# Patient Record
Sex: Male | Born: 1975 | Hispanic: Yes | Marital: Single | Smoking: Current every day smoker
Health system: Southern US, Community
[De-identification: ages and names within clinical notes are randomized; demographics above are authoritative.]

## PROBLEM LIST (undated history)

## (undated) DIAGNOSIS — R634 Abnormal weight loss: Secondary | ICD-10-CM

## (undated) HISTORY — PX: NO PAST SURGERIES: SHX2092

---

## 2020-05-09 ENCOUNTER — Emergency Department (HOSPITAL_COMMUNITY): Payer: Self-pay

## 2020-05-09 ENCOUNTER — Other Ambulatory Visit: Payer: Self-pay

## 2020-05-09 ENCOUNTER — Emergency Department (HOSPITAL_COMMUNITY)
Admission: EM | Admit: 2020-05-09 | Discharge: 2020-05-09 | Disposition: A | Discharge status: Home / Self Care | Payer: Self-pay | Attending: Emergency Medicine | Admitting: Emergency Medicine

## 2020-05-09 ENCOUNTER — Encounter (HOSPITAL_COMMUNITY): Payer: Self-pay

## 2020-05-09 DIAGNOSIS — R110 Nausea: Secondary | ICD-10-CM | POA: Insufficient documentation

## 2020-05-09 DIAGNOSIS — R1031 Right lower quadrant pain: Secondary | ICD-10-CM | POA: Insufficient documentation

## 2020-05-09 DIAGNOSIS — Z20822 Contact with and (suspected) exposure to covid-19: Secondary | ICD-10-CM | POA: Insufficient documentation

## 2020-05-09 DIAGNOSIS — I10 Essential (primary) hypertension: Secondary | ICD-10-CM | POA: Insufficient documentation

## 2020-05-09 LAB — CBC
HCT: 46.8 % (ref 39.0–52.0)
Hemoglobin: 16.5 g/dL (ref 13.0–17.0)
MCH: 32.2 pg (ref 26.0–34.0)
MCHC: 35.3 g/dL (ref 30.0–36.0)
MCV: 91.2 fL (ref 80.0–100.0)
Platelets: 298 10*3/uL (ref 150–400)
RBC: 5.13 MIL/uL (ref 4.22–5.81)
RDW: 12.2 % (ref 11.5–15.5)
WBC: 15.7 10*3/uL — ABNORMAL HIGH (ref 4.0–10.5)
nRBC: 0 % (ref 0.0–0.2)

## 2020-05-09 LAB — URINALYSIS, ROUTINE W REFLEX MICROSCOPIC
Bilirubin Urine: NEGATIVE
Glucose, UA: NEGATIVE mg/dL
Hgb urine dipstick: NEGATIVE
Ketones, ur: NEGATIVE mg/dL
Leukocytes,Ua: NEGATIVE
Nitrite: NEGATIVE
Protein, ur: NEGATIVE mg/dL
Specific Gravity, Urine: 1.013 (ref 1.005–1.030)
pH: 8 (ref 5.0–8.0)

## 2020-05-09 LAB — COMPREHENSIVE METABOLIC PANEL
ALT: 48 U/L — ABNORMAL HIGH (ref 0–44)
AST: 34 U/L (ref 15–41)
Albumin: 3.9 g/dL (ref 3.5–5.0)
Alkaline Phosphatase: 78 U/L (ref 38–126)
Anion gap: 10 (ref 5–15)
BUN: 10 mg/dL (ref 6–20)
CO2: 26 mmol/L (ref 22–32)
Calcium: 9.4 mg/dL (ref 8.9–10.3)
Chloride: 101 mmol/L (ref 98–111)
Creatinine, Ser: 0.73 mg/dL (ref 0.61–1.24)
GFR calc Af Amer: >60 mL/min (ref >60)
GFR calc non Af Amer: >60 mL/min (ref >60)
Glucose, Bld: 100 mg/dL — ABNORMAL HIGH (ref 70–99)
Potassium: 3.8 mmol/L (ref 3.5–5.1)
Sodium: 137 mmol/L (ref 135–145)
Total Bilirubin: 0.9 mg/dL (ref 0.3–1.2)
Total Protein: 7.3 g/dL (ref 6.5–8.1)

## 2020-05-09 LAB — LIPASE, BLOOD: Lipase: 23 U/L (ref 11–51)

## 2020-05-09 LAB — RESPIRATORY PANEL BY RT PCR (FLU A&B, COVID)
Influenza A by PCR: NEGATIVE
Influenza B by PCR: NEGATIVE
SARS Coronavirus 2 by RT PCR: NEGATIVE

## 2020-05-09 MED ORDER — MORPHINE SULFATE (PF) 4 MG/ML IV SOLN
4.0000 mg | Freq: Once | INTRAVENOUS | Status: AC
  Administered 2020-05-09: 4 mg via INTRAVENOUS
  Filled 2020-05-09: qty 1

## 2020-05-09 MED ORDER — HYDROCODONE-ACETAMINOPHEN 5-325 MG PO TABS
1.0000 | ORAL_TABLET | Freq: Once | ORAL | Status: AC
  Administered 2020-05-09: 1 via ORAL
  Filled 2020-05-09: qty 1

## 2020-05-09 MED ORDER — IOHEXOL 300 MG/ML  SOLN
100.0000 mL | Freq: Once | INTRAMUSCULAR | Status: AC | PRN
  Administered 2020-05-09: 100 mL via INTRAVENOUS

## 2020-05-09 NOTE — ED Notes (Signed)
During triage pt also reports SOB on exertion and mild cough, denies having any COVID vaccines

## 2020-05-09 NOTE — Social Work (Signed)
CSW met with Pt at bedside with the aid of [employer] [facility] # [phone]) CSW counseled Pt on importance of utilizing a PCP. CSW provided Pt with information about [facility] as it is the closest to his home address. Information was also added to AVS

## 2020-05-09 NOTE — ED Provider Notes (Signed)
[facility] EMERGENCY DEPARTMENT Provider Note   CSN: [id number] Arrival date & time: 05/09/20  1053     History Chief Complaint  Patient presents with   Abdominal Pain    Spencer Whitaker is a 44 y.o. male with past medical history of hypertension who presents with abdominal pain.  Patient endorses acute onset of pain around 3 AM with nausea. Pain was initially more generalized, but has since moved to his right lower quadrant. Denies emesis, fever, changes in bowel habits, or urinary symptoms. Denies penile or testicular pain or swelling. Has never had pain like this before. He is no longer nauseous at time of encounter.  Patient states he moved from [place] in January and has not had a PCP since. States he was taking blood pressure medication in [place], but has not had that medication since he moved.   Abdominal Pain Pain location:  RLQ Pain radiates to:  Back Pain severity:  Severe Onset quality:  Sudden Timing:  Constant Progression:  Unchanged Chronicity:  New Relieved by:  Nothing Worsened by:  Palpation Ineffective treatments:  None tried Associated symptoms: nausea   Associated symptoms: no chest pain, no chills, no constipation, no cough, no diarrhea, no dysuria, no fever, no hematuria, no shortness of breath, no sore throat and no vomiting        History reviewed. No pertinent past medical history.  There are no problems to display for this patient.   History reviewed. No pertinent surgical history.     No family history on file.  Social History   Tobacco Use   Smoking status: Not on file  Substance Use Topics   Alcohol use: Not on file   Drug use: Not on file    Home Medications Prior to Admission medications   Not on File    Allergies    Patient has no allergy information on record.  Review of Systems   Review of Systems  Constitutional: Negative for chills and fever.  HENT: Negative for ear pain and sore  throat.   Eyes: Negative for pain and visual disturbance.  Respiratory: Negative for cough and shortness of breath.   Cardiovascular: Negative for chest pain and palpitations.  Gastrointestinal: Positive for abdominal pain and nausea. Negative for constipation, diarrhea and vomiting.  Genitourinary: Negative for dysuria and hematuria.  Musculoskeletal: Negative for arthralgias and back pain.  Skin: Negative for color change and rash.  Neurological: Negative for seizures and syncope.  All other systems reviewed and are negative.   Physical Exam Updated Vital Signs BP (!) 136/103 (BP Location: Left Arm)    Pulse 83    Temp 99.4 F (37.4 C) (Oral)    Resp 15    Ht 5' 8.9" (1.75 m)    Wt 70 kg    SpO2 99%    BMI 22.86 kg/m   Physical Exam Vitals and nursing note reviewed.  Constitutional:      Appearance: He is well-developed.  HENT:     Head: Normocephalic and atraumatic.  Eyes:     Conjunctiva/sclera: Conjunctivae normal.  Cardiovascular:     Rate and Rhythm: Normal rate and regular rhythm.     Heart sounds: No murmur heard.   Pulmonary:     Effort: Pulmonary effort is normal. No respiratory distress.     Breath sounds: Normal breath sounds.  Abdominal:     Palpations: Abdomen is soft.     Tenderness: There is abdominal tenderness in the right lower quadrant.  There is no right CVA tenderness, left CVA tenderness, guarding or rebound. Negative signs include Rovsing's sign.  Musculoskeletal:     Cervical back: Neck supple.  Skin:    General: Skin is warm and dry.  Neurological:     General: No focal deficit present.     Mental Status: He is alert and oriented to person, place, and time.     ED Results / Procedures / Treatments   Labs (all labs ordered are listed, but only abnormal results are displayed) Labs Reviewed  COMPREHENSIVE METABOLIC PANEL - Abnormal; Notable for the following components:      Result Value   Glucose, Bld 100 (*)    ALT 48 (*)    All other  components within normal limits  CBC - Abnormal; Notable for the following components:   WBC 15.7 (*)    All other components within normal limits  RESPIRATORY PANEL BY RT PCR (FLU A&B, COVID)  LIPASE, BLOOD  URINALYSIS, ROUTINE W REFLEX MICROSCOPIC    EKG None  Radiology CT ABDOMEN PELVIS W CONTRAST  Result Date: 05/09/2020 CLINICAL DATA:  Right lower quadrant abdominal pain. EXAM: CT ABDOMEN AND PELVIS WITH CONTRAST TECHNIQUE: Multidetector CT imaging of the abdomen and pelvis was performed using the standard protocol following bolus administration of intravenous contrast. CONTRAST:  OMNIPAQUE IOHEXOL 300 MG/ML  SOLN COMPARISON:  None. FINDINGS: Lower chest: Very mild atelectasis is seen within the posterior aspects of the bilateral lung bases. Hepatobiliary: There is mild diffuse fatty infiltration of the liver parenchyma. No focal liver abnormality is seen. No gallstones, gallbladder wall thickening, or biliary dilatation. Pancreas: Unremarkable. No pancreatic ductal dilatation or surrounding inflammatory changes. Spleen: Normal in size without focal abnormality. Adrenals/Urinary Tract: Adrenal glands are unremarkable. Kidneys are normal, without renal calculi, focal lesion, or hydronephrosis. Urinary bladder is partially contracted. Mild diffuse urinary bladder wall thickening is noted. Stomach/Bowel: Stomach is within normal limits. Appendix appears normal. No evidence of bowel dilatation. Noninflamed diverticula are seen throughout the large bowel. Vascular/Lymphatic: No significant vascular findings are present. No enlarged abdominal or pelvic lymph nodes. Reproductive: Prostate is unremarkable. Other: No abdominal wall hernia or abnormality. No abdominopelvic ascites. Musculoskeletal: No acute or significant osseous findings. IMPRESSION: 1. Colonic diverticulosis. 2. Fatty liver. 3. Mild urinary bladder wall thickening, likely secondary to the contracted nature of the bladder.  Electronically Signed   By: Aram Candela M.D.   On: 05/09/2020 20:28   DG Chest Portable 1 View  Result Date: 05/09/2020 CLINICAL DATA:  Shortness of breath on exertion EXAM: PORTABLE CHEST 1 VIEW COMPARISON:  None. FINDINGS: Bilateral interstitial thickening. No pleural effusion or pneumothorax. Normal cardiomediastinal silhouette. No aggressive osseous lesion. IMPRESSION: Bilateral interstitial thickening concerning for mild interstitial edema versus infection. Electronically Signed   By: Elige Ko   On: 05/09/2020 12:34    Procedures Procedures (including critical care time)  Medications Ordered in ED Medications  HYDROcodone-acetaminophen (NORCO/VICODIN) 5-325 MG per tablet 1 tablet (1 tablet Oral Given 05/09/20 1636)  morphine 4 MG/ML injection 4 mg (4 mg Intravenous Given 05/09/20 1824)  iohexol (OMNIPAQUE) 300 MG/ML solution 100 mL (100 mLs Intravenous Contrast Given 05/09/20 2011)    ED Course  I have reviewed the triage vital signs and the nursing notes.  Pertinent labs & imaging results that were available during my care of the patient were reviewed by me and considered in my medical decision making (see chart for details).    MDM Rules/Calculators/A&P  UA not consistent with UTI.  Normal lipase not consistent with pancreatitis.  Patient has mild elevation of ALT and a leukocytosis of 15.7. Covid negative.  CT abdomen negative for acute appendicitis or other intra-abdominal pathology.   No clear etiology of patient symptoms at this time.  He was given strict return precautions for signs of developing appendicitis.  Patient verbalized understanding and agreement, and was discharged home.   In addition, consult placed for care transition team for PCP needs.  Patient instructed to call [facility] to schedule an appointment.  This patient was seen with Dr. Particia Nearing. A spanish interpreter was used for the duration of this encounter.   Final Clinical Impression(s) / ED Diagnoses Final diagnoses:  Right lower quadrant abdominal pain    Rx / DC Orders ED Discharge Orders    None       Allayne Butcher, MD 05/09/20 2130    Jacalyn Lefevre, MD 05/09/20 2325

## 2020-05-09 NOTE — ED Triage Notes (Signed)
Spanish interpreter used for triage:   Pt reports abd pain since 3am that woke him out of his sleep. Pt having some n/v. Denies diarrhea or constipation.

## 2020-05-09 NOTE — Discharge Instructions (Addendum)
You may take tylenol (also called acetominophen) or ibuprofen (also called advil or motrin) for pain at home. Please return to the emergency department if you have worsening pain, vomiting, fever over 100.4, or other concerning symtpoms.  Call [facility] to make an appointment with a general doctor for your high blood pressure. The phone number is included in these papers.

## 2020-05-09 NOTE — ED Triage Notes (Signed)
Emergency Medicine Provider Triage Evaluation Note  Spencer Whitaker , a 44 y.o. male  was evaluated in triage.  Pt complains of abd pain.   Pt states he had acute onset abd pain around 3 am. He has associated nausea, no vomiting. No fever. No problems with BM or urination. No h/o similar. 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 He has no other medical problems. Takes no medications daily.   Review of Systems  Positive: abd pain, nausea Negative: Fever, diarrhea, constipation, urinary sx  Physical Exam  BP 121/82 (BP Location: Right Arm)    Pulse 88    Temp 99.4 F (37.4 C) (Oral)    Resp 16    Ht 5' 8.9" (1.75 m)    Wt 70 kg    SpO2 97%    BMI 22.86 kg/m  Gen:   Awake, no distress   HEENT:  Atraumatic  Resp:  Normal effort  Cardiac:  Normal rate Abd:   Nondistended. Mild ttp of rlq, pt reports pain with generalized ttp. Negative murphys.  MSK:   Moves extremities without difficulty  Neuro:  Speech clear  Medical Decision Making  Medically screening exam initiated at 4:22 PM.  Appropriate orders placed.  Spencer Whitaker was informed that the remainder of the evaluation will be completed by another provider, this initial triage assessment does not replace that evaluation, and the importance of remaining in the ED until their evaluation is complete.  Clinical Impression    Pt with abd pain, rosen in RLQ. associated nausea. Labs show mild leukocytosis. As pt has worsened pain in rlq, will order ct to r/o appy vs kidney stone vs viral gi illness.    Alveria Apley, PA-C 05/09/20 1627

## 2020-05-09 NOTE — Progress Notes (Signed)
   05/09/20 1800  TOC ED Mini Assessment  TOC Time spent with patient (minutes): 20  PING Used in TOC Assessment No  Admission or Readmission Diverted Yes  Interventions which prevented an admission or readmission Other (must enter comment) (PCP resources provided (after hours for appointment))  What brought you to the Emergency Department?  Abdominal pain  Barriers to Discharge No Barriers Identified  Means of departure Not know  CMS Medicare.gov Compare Post Acute Care list provided to: Patient  Choice offered to / list presented to  Patient

## 2020-05-27 ENCOUNTER — Telehealth: Payer: Self-pay | Admitting: Internal Medicine

## 2021-04-20 ENCOUNTER — Observation Stay (HOSPITAL_COMMUNITY)
Admission: EM | Admit: 2021-04-20 | Discharge: 2021-04-21 | Disposition: A | Discharge status: Home / Self Care | Payer: Self-pay | Attending: Emergency Medicine | Admitting: Emergency Medicine

## 2021-04-20 ENCOUNTER — Encounter (HOSPITAL_COMMUNITY): Payer: Self-pay | Admitting: Emergency Medicine

## 2021-04-20 ENCOUNTER — Other Ambulatory Visit: Payer: Self-pay

## 2021-04-20 ENCOUNTER — Emergency Department (HOSPITAL_COMMUNITY): Payer: Self-pay

## 2021-04-20 DIAGNOSIS — K3580 Unspecified acute appendicitis: Principal | ICD-10-CM | POA: Diagnosis present

## 2021-04-20 DIAGNOSIS — Z20822 Contact with and (suspected) exposure to covid-19: Secondary | ICD-10-CM | POA: Insufficient documentation

## 2021-04-20 DIAGNOSIS — F17200 Nicotine dependence, unspecified, uncomplicated: Secondary | ICD-10-CM | POA: Insufficient documentation

## 2021-04-20 HISTORY — DX: Abnormal weight loss: R63.4

## 2021-04-20 LAB — CBC WITH DIFFERENTIAL/PLATELET
Abs Immature Granulocytes: 0.08 10*3/uL — ABNORMAL HIGH (ref 0.00–0.07)
Basophils Absolute: 0.1 10*3/uL (ref 0.0–0.1)
Basophils Relative: 0 %
Eosinophils Absolute: 0.1 10*3/uL (ref 0.0–0.5)
Eosinophils Relative: 1 %
HCT: 44.9 % (ref 39.0–52.0)
Hemoglobin: 15.8 g/dL (ref 13.0–17.0)
Immature Granulocytes: 0 %
Lymphocytes Relative: 15 %
Lymphs Abs: 2.9 10*3/uL (ref 0.7–4.0)
MCH: 31.5 pg (ref 26.0–34.0)
MCHC: 35.2 g/dL (ref 30.0–36.0)
MCV: 89.6 fL (ref 80.0–100.0)
Monocytes Absolute: 1.4 10*3/uL — ABNORMAL HIGH (ref 0.1–1.0)
Monocytes Relative: 7 %
Neutro Abs: 14.3 10*3/uL — ABNORMAL HIGH (ref 1.7–7.7)
Neutrophils Relative %: 77 %
Platelets: 319 10*3/uL (ref 150–400)
RBC: 5.01 MIL/uL (ref 4.22–5.81)
RDW: 12.4 % (ref 11.5–15.5)
WBC: 18.8 10*3/uL — ABNORMAL HIGH (ref 4.0–10.5)
nRBC: 0 % (ref 0.0–0.2)

## 2021-04-20 LAB — COMPREHENSIVE METABOLIC PANEL
ALT: 32 U/L (ref 0–44)
AST: 25 U/L (ref 15–41)
Albumin: 4.5 g/dL (ref 3.5–5.0)
Alkaline Phosphatase: 88 U/L (ref 38–126)
Anion gap: 9 (ref 5–15)
BUN: 15 mg/dL (ref 6–20)
CO2: 23 mmol/L (ref 22–32)
Calcium: 9.5 mg/dL (ref 8.9–10.3)
Chloride: 110 mmol/L (ref 98–111)
Creatinine, Ser: 0.86 mg/dL (ref 0.61–1.24)
GFR, Estimated: >60 mL/min (ref >60)
Glucose, Bld: 123 mg/dL — ABNORMAL HIGH (ref 70–99)
Potassium: 3.9 mmol/L (ref 3.5–5.1)
Sodium: 142 mmol/L (ref 135–145)
Total Bilirubin: 0.8 mg/dL (ref 0.3–1.2)
Total Protein: 8 g/dL (ref 6.5–8.1)

## 2021-04-20 LAB — LIPASE, BLOOD: Lipase: 25 U/L (ref 11–51)

## 2021-04-20 LAB — RESP PANEL BY RT-PCR (FLU A&B, COVID) ARPGX2
Influenza A by PCR: NEGATIVE
Influenza B by PCR: NEGATIVE
SARS Coronavirus 2 by RT PCR: NEGATIVE

## 2021-04-20 MED ORDER — MORPHINE SULFATE (PF) 4 MG/ML IV SOLN
4.0000 mg | Freq: Once | INTRAVENOUS | Status: AC
  Administered 2021-04-20: 4 mg via INTRAVENOUS
  Filled 2021-04-20: qty 1

## 2021-04-20 MED ORDER — ACETAMINOPHEN 325 MG PO TABS: 650.0000 mg | ORAL_TABLET | Freq: Four times a day (QID) | ORAL | Status: DC | PRN

## 2021-04-20 MED ORDER — OXYCODONE HCL 5 MG PO TABS
5.0000 mg | ORAL_TABLET | ORAL | Status: DC | PRN
  Administered 2021-04-21: 5 mg via ORAL
  Administered 2021-04-21: 10 mg via ORAL
  Filled 2021-04-20: qty 1
  Filled 2021-04-20: qty 2

## 2021-04-20 MED ORDER — INFLUENZA VAC SPLIT QUAD 0.5 ML IM SUSY
0.5000 mL | PREFILLED_SYRINGE | INTRAMUSCULAR | Status: DC
  Filled 2021-04-20: qty 0.5

## 2021-04-20 MED ORDER — SODIUM CHLORIDE 0.9 % IV SOLN
2.0000 g | Freq: Once | INTRAVENOUS | Status: AC
  Administered 2021-04-20: 2 g via INTRAVENOUS
  Filled 2021-04-20: qty 20

## 2021-04-20 MED ORDER — SODIUM CHLORIDE 0.9 % IV SOLN: 2.0000 g | INTRAVENOUS | Status: DC

## 2021-04-20 MED ORDER — METRONIDAZOLE 500 MG/100ML IV SOLN: 500.0000 mg | Freq: Two times a day (BID) | INTRAVENOUS | Status: DC

## 2021-04-20 MED ORDER — ONDANSETRON 4 MG PO TBDP: 4.0000 mg | ORAL_TABLET | Freq: Four times a day (QID) | ORAL | Status: DC | PRN

## 2021-04-20 MED ORDER — ONDANSETRON HCL 4 MG/2ML IJ SOLN
4.0000 mg | Freq: Four times a day (QID) | INTRAMUSCULAR | Status: DC | PRN
  Administered 2021-04-21 (×2): 4 mg via INTRAVENOUS
  Filled 2021-04-20 (×2): qty 2

## 2021-04-20 MED ORDER — ACETAMINOPHEN 650 MG RE SUPP: 650.0000 mg | Freq: Four times a day (QID) | RECTAL | Status: DC | PRN

## 2021-04-20 MED ORDER — IOHEXOL 350 MG/ML SOLN
80.0000 mL | Freq: Once | INTRAVENOUS | Status: AC | PRN
  Administered 2021-04-20: 80 mL via INTRAVENOUS

## 2021-04-20 MED ORDER — LACTATED RINGERS IV SOLN: INTRAVENOUS | Status: DC

## 2021-04-20 MED ORDER — ONDANSETRON 4 MG PO TBDP
4.0000 mg | ORAL_TABLET | Freq: Once | ORAL | Status: AC
  Administered 2021-04-20: 4 mg via ORAL
  Filled 2021-04-20: qty 1

## 2021-04-20 MED ORDER — HYDROMORPHONE HCL 1 MG/ML IJ SOLN
1.0000 mg | INTRAMUSCULAR | Status: DC | PRN
Start: 2021-04-20 — End: 2021-04-22
  Administered 2021-04-21 (×2): 1 mg via INTRAVENOUS
  Filled 2021-04-20 (×2): qty 1

## 2021-04-20 MED ORDER — METRONIDAZOLE 500 MG/100ML IV SOLN
500.0000 mg | Freq: Once | INTRAVENOUS | Status: AC
  Administered 2021-04-20: 500 mg via INTRAVENOUS
  Filled 2021-04-20: qty 100

## 2021-04-20 NOTE — ED Provider Notes (Signed)
Emergency Medicine Provider Triage Evaluation Note  Spencer Whitaker , a 45 y.o. male  was evaluated in triage.  Pt complains of nausea lower abdominal pain.  Patient states symptoms began today, around 3:00.  Pain is in his lower abdomen and around his bellybutton.  He has nausea, but no vomiting.  Urination and bowel movements are normal.  Review of Systems  Positive: Abd pain, nausea Negative: fever  Physical Exam  BP (!) 174/120 (BP Location: Right Arm)   Pulse 82   Temp 98.6 F (37 C) (Oral)   Resp 16   SpO2 99%  Gen:   Awake, no distress   Resp:  Normal effort  MSK:   Moves extremities without difficulty  Other:  Tenderness palpation of periumbilical area as well as lower abdomen bilaterally.  Medical Decision Making  Medically screening exam initiated at 6:26 PM.  Appropriate orders placed.  Spencer Whitaker was informed that the remainder of the evaluation will be completed by another provider, this initial triage assessment does not replace that evaluation, and the importance of remaining in the ED until their evaluation is complete.  Labs and CT   Alveria Apley, PA-C 04/20/21 1826    Pricilla Loveless, MD 04/20/21 [phone]

## 2021-04-20 NOTE — ED Triage Notes (Signed)
Patient reports lower abd pain and n/v since earlier today. Denies any medical history.

## 2021-04-20 NOTE — H&P (Signed)
Spencer Whitaker is an 45 y.o. male.         Chief Complaint: abdominal pain, acute appendicitis  HPI: Patient is a 45 year old Hispanic male who presents to the emergency department with sudden onset of right-sided abdominal pain approximately 3 PM today while at work.  Patient works in a Scientist, clinical (histocompatibility and immunogenetics).  Pain developed in the right lower abdomen and has been associated with nausea but no emesis.  Patient also notes pain at the umbilicus.  He has had no prior abdominal surgery.  In the emergency department, his white blood cell count was elevated at 18.8.  Patient underwent CT scan of the abdomen and pelvis which shows findings consistent with early acute appendicitis without complication.  Patient only speaks [place].  His entire interview was carried out with the assistance of a translator by telephone.  Patient denies any prescription medications.  He has no known drug allergies.  He is originally from [place].  History reviewed. No pertinent past medical history.  History reviewed. No pertinent surgical history.  History reviewed. No pertinent family history. Social History:  has no history on file for tobacco use, alcohol use, and drug use.  Allergies: No Known Allergies  (Not in a hospital admission)   Results for orders placed or performed during the hospital encounter of 04/20/21 (from the past 48 hour(s))  CBC with Differential     Status: Abnormal   Collection Time: 04/20/21  7:13 PM  Result Value Ref Range   WBC 18.8 (H) 4.0 - 10.5 K/uL   RBC 5.01 4.22 - 5.81 MIL/uL   Hemoglobin 15.8 13.0 - 17.0 g/dL   HCT 63.7 85.8 - 85.0 %   MCV 89.6 80.0 - 100.0 fL   MCH 31.5 26.0 - 34.0 pg   MCHC 35.2 30.0 - 36.0 g/dL   RDW 27.7 41.2 - 87.8 %   Platelets 319 150 - 400 K/uL   nRBC 0.0 0.0 - 0.2 %   Neutrophils Relative % 77 %   Neutro Abs 14.3 (H) 1.7 - 7.7 K/uL   Lymphocytes Relative 15 %   Lymphs Abs 2.9 0.7 - 4.0 K/uL   Monocytes Relative 7 %    Monocytes Absolute 1.4 (H) 0.1 - 1.0 K/uL   Eosinophils Relative 1 %   Eosinophils Absolute 0.1 0.0 - 0.5 K/uL   Basophils Relative 0 %   Basophils Absolute 0.1 0.0 - 0.1 K/uL   Immature Granulocytes 0 %   Abs Immature Granulocytes 0.08 (H) 0.00 - 0.07 K/uL    Comment: Performed at [facility], [address], [place], [place] [address]  Comprehensive metabolic panel     Status: Abnormal   Collection Time: 04/20/21  7:13 PM  Result Value Ref Range   Sodium 142 135 - 145 mmol/L   Potassium 3.9 3.5 - 5.1 mmol/L   Chloride 110 98 - 111 mmol/L   CO2 23 22 - 32 mmol/L   Glucose, Bld 123 (H) 70 - 99 mg/dL    Comment: Glucose reference range applies only to samples taken after fasting for at least 8 hours.   BUN 15 6 - 20 mg/dL   Creatinine, Ser 0.94 0.61 - 1.24 mg/dL   Calcium 9.5 8.9 - 70.9 mg/dL   Total Protein 8.0 6.5 - 8.1 g/dL   Albumin 4.5 3.5 - 5.0 g/dL   AST 25 15 - 41 U/L   ALT 32 0 - 44 U/L   Alkaline Phosphatase 88 38 - 126 U/L  Total Bilirubin 0.8 0.3 - 1.2 mg/dL   GFR, Estimated >73 >53 mL/min    Comment: (NOTE) Calculated using the CKD-EPI Creatinine Equation (2021)    Anion gap 9 5 - 15    Comment: Performed at [facility], [address], [place], [place] [address]  Lipase, blood     Status: None   Collection Time: 04/20/21  7:13 PM  Result Value Ref Range   Lipase 25 11 - 51 U/L    Comment: Performed at [facility], [address], [place], [place] [address]   CT ABDOMEN PELVIS W CONTRAST  Result Date: 04/20/2021 CLINICAL DATA:  Lower abdominal pain with nausea and vomiting. EXAM: CT ABDOMEN AND PELVIS WITH CONTRAST TECHNIQUE: Multidetector CT imaging of the abdomen and pelvis was performed using the standard protocol following bolus administration of intravenous contrast. CONTRAST:  82mL OMNIPAQUE IOHEXOL 350 MG/ML SOLN COMPARISON:  May 09, 2020 FINDINGS: Lower chest: Mild atelectasis is seen within  the bilateral lung bases. Hepatobiliary: No focal liver abnormality is seen. No gallstones, gallbladder wall thickening, or biliary dilatation. Pancreas: Unremarkable. No pancreatic ductal dilatation or surrounding inflammatory changes. Spleen: Normal in size without focal abnormality. Adrenals/Urinary Tract: Adrenal glands are unremarkable. Kidneys are normal, without renal calculi, focal lesion, or hydronephrosis. Bladder is unremarkable. Stomach/Bowel: Stomach is within normal limits. Mild, proximal periappendiceal inflammatory fat stranding is seen. Mild thickening of the proximal portion of the appendix is also noted. No evidence of bowel dilatation. Noninflamed diverticula are seen throughout the sigmoid colon. Vascular/Lymphatic: No significant vascular findings are present. No enlarged abdominal or pelvic lymph nodes. Reproductive: Prostate is unremarkable. Other: No abdominal wall hernia or abnormality. No abdominopelvic ascites. Musculoskeletal: No acute or significant osseous findings. IMPRESSION: 1. Mild, uncomplicated appendicitis. 2. Sigmoid diverticulosis. Electronically Signed   By: Aram Candela M.D.   On: 04/20/2021 20:36    Review of Systems  Constitutional:  Positive for appetite change.  HENT: Negative.    Eyes: Negative.   Respiratory: Negative.    Cardiovascular: Negative.   Gastrointestinal:  Positive for abdominal pain and nausea.  Endocrine: Negative.   Genitourinary: Negative.   Musculoskeletal: Negative.   Skin: Negative.   Allergic/Immunologic: Negative.   Neurological: Negative.   Hematological: Negative.   Psychiatric/Behavioral: Negative.      Physical Exam   Blood pressure (!) 158/103, pulse 71, temperature 99 F (37.2 C), temperature source Oral, resp. rate 18, SpO2 93 %.  CONSTITUTIONAL: Mild to moderate discomfort; conversant; no obvious deformities  EYES: conjunctiva moist; no lid lag; anicteric; pupils equal bilaterally  NECK: trachea midline;  no thyroid nodularity  LUNGS: respiratory effort normal & unlabored; no wheeze; no rales; no tactile fremitus  CV: rate and rhythm regular; no palpable thrills; no murmur; no edema bilat lower extremities  GI: abdomen soft without distension; protuberant; tender at umbilicus; tender to palpation RLQ with guarding; no hepatosplenomegaly; no obvious hernia  MSK: normal range of motion of extremities; no clubbing; no cyanosis  PSYCH: appropriate affect for situation; alert and oriented to person, place, & time  LYMPHATIC: no palpable cervical lymphadenopathy; no evidence lymphedema in extremities    Assessment/Plan Acute appendicitis  Admit to surgical service  IV abx's per protocol, IV hydration, NPO  Plan lap appendectomy in AM 9/13 by Dr. Gaynelle Adu  I discussed the need for admission and surgery with the patient via the translator.  I explained to him that we would need to do a COVID test this evening as  that has not been initiated here in the emergency room.  This will take a few hours to complete.  We will admit him to start IV antibiotics, pain medication as needed, and medication for nausea as needed.  We will plan to proceed with laparoscopic appendectomy tomorrow morning by Dr. Gaynelle Adu.  Patient complains of pain at the umbilicus.  There is no obvious hernia.  This can be evaluated at the time of surgery as well.  This is discussed through the translator and the patient appears to understand and agrees with this plan.  Darnell Level, MD [facility] Surgery A [facility] practice Office: [phone]   Darnell Level, MD 04/20/2021, 9:55 PM

## 2021-04-20 NOTE — ED Provider Notes (Signed)
[facility]-EMERGENCY DEPT Provider Note   CSN: [id number] Arrival date & time: 04/20/21  1757     History Chief Complaint  Patient presents with   Abdominal Pain    Spencer Whitaker is a 45 y.o. male.  Patient presents with abdominal pain ongoing since this morning.  Describes as persistent ache in the right lower quadrant.  No associated fevers no cough no vomiting or diarrhea.  Patient states pain is worse when he pushes on his belly or moves a certain way.      History reviewed. No pertinent past medical history.  There are no problems to display for this patient.   History reviewed. No pertinent surgical history.     History reviewed. No pertinent family history.     Home Medications Prior to Admission medications   Not on File    Allergies    Patient has no allergy information on record.  Review of Systems   Review of Systems  Constitutional:  Negative for fever.  HENT:  Negative for ear pain and sore throat.   Eyes:  Negative for pain.  Respiratory:  Negative for cough.   Cardiovascular:  Negative for chest pain.  Gastrointestinal:  Positive for abdominal pain.  Genitourinary:  Negative for flank pain.  Musculoskeletal:  Negative for back pain.  Skin:  Negative for color change and rash.  Neurological:  Negative for syncope.  All other systems reviewed and are negative.  Physical Exam Updated Vital Signs BP (!) 158/103   Pulse 71   Temp 99 F (37.2 C) (Oral)   Resp 18   SpO2 93%   Physical Exam Constitutional:      Appearance: He is well-developed.  HENT:     Head: Normocephalic.     Nose: Nose normal.  Eyes:     Extraocular Movements: Extraocular movements intact.  Cardiovascular:     Rate and Rhythm: Normal rate.  Pulmonary:     Effort: Pulmonary effort is normal.  Abdominal:     Tenderness: There is abdominal tenderness.  Skin:    Coloration: Skin is not jaundiced.  Neurological:     Mental  Status: He is alert. Mental status is at baseline.    ED Results / Procedures / Treatments   Labs (all labs ordered are listed, but only abnormal results are displayed) Labs Reviewed  CBC WITH DIFFERENTIAL/PLATELET - Abnormal; Notable for the following components:      Result Value   WBC 18.8 (*)    Neutro Abs 14.3 (*)    Monocytes Absolute 1.4 (*)    Abs Immature Granulocytes 0.08 (*)    All other components within normal limits  COMPREHENSIVE METABOLIC PANEL - Abnormal; Notable for the following components:   Glucose, Bld 123 (*)    All other components within normal limits  RESP PANEL BY RT-PCR (FLU A&B, COVID) ARPGX2  LIPASE, BLOOD    EKG None  Radiology CT ABDOMEN PELVIS W CONTRAST  Result Date: 04/20/2021 CLINICAL DATA:  Lower abdominal pain with nausea and vomiting. EXAM: CT ABDOMEN AND PELVIS WITH CONTRAST TECHNIQUE: Multidetector CT imaging of the abdomen and pelvis was performed using the standard protocol following bolus administration of intravenous contrast. CONTRAST:  63mL OMNIPAQUE IOHEXOL 350 MG/ML SOLN COMPARISON:  May 09, 2020 FINDINGS: Lower chest: Mild atelectasis is seen within the bilateral lung bases. Hepatobiliary: No focal liver abnormality is seen. No gallstones, gallbladder wall thickening, or biliary dilatation. Pancreas: Unremarkable. No pancreatic ductal dilatation or surrounding inflammatory changes.  Spleen: Normal in size without focal abnormality. Adrenals/Urinary Tract: Adrenal glands are unremarkable. Kidneys are normal, without renal calculi, focal lesion, or hydronephrosis. Bladder is unremarkable. Stomach/Bowel: Stomach is within normal limits. Mild, proximal periappendiceal inflammatory fat stranding is seen. Mild thickening of the proximal portion of the appendix is also noted. No evidence of bowel dilatation. Noninflamed diverticula are seen throughout the sigmoid colon. Vascular/Lymphatic: No significant vascular findings are present. No  enlarged abdominal or pelvic lymph nodes. Reproductive: Prostate is unremarkable. Other: No abdominal wall hernia or abnormality. No abdominopelvic ascites. Musculoskeletal: No acute or significant osseous findings. IMPRESSION: 1. Mild, uncomplicated appendicitis. 2. Sigmoid diverticulosis. Electronically Signed   By: Aram Candela M.D.   On: 04/20/2021 20:36    Procedures Procedures   Medications Ordered in ED Medications  ondansetron (ZOFRAN-ODT) disintegrating tablet 4 mg (has no administration in time range)  morphine 4 MG/ML injection 4 mg (has no administration in time range)  cefTRIAXone (ROCEPHIN) 2 g in sodium chloride 0.9 % 100 mL IVPB (has no administration in time range)    And  metroNIDAZOLE (FLAGYL) IVPB 500 mg (has no administration in time range)  iohexol (OMNIPAQUE) 350 MG/ML injection 80 mL (80 mLs Intravenous Contrast Given 04/20/21 2019)    ED Course  I have reviewed the triage vital signs and the nursing notes.  Pertinent labs & imaging results that were available during my care of the patient were reviewed by me and considered in my medical decision making (see chart for details).    MDM Rules/Calculators/A&P                           Patient has a white count of 18.  On exam is tender in the right lower quadrant positive guarding.  No rebound noted.  CT abdomen pelvis concerning for acute appendicitis without any evidence of rupture.  Case discussed with surgery Dr.Jenkins, who agreed to see the patient.  Patient started on IV Rocephin and Flagyl.  Will be brought in to surgery for acute appendicitis.   Final Clinical Impression(s) / ED Diagnoses Final diagnoses:  Acute appendicitis, unspecified acute appendicitis type    Rx / DC Orders ED Discharge Orders     None        Cheryll Cockayne, MD 04/20/21 2126

## 2021-04-21 ENCOUNTER — Encounter (HOSPITAL_COMMUNITY)
Admission: EM | Disposition: A | Discharge status: Home / Self Care | Payer: Self-pay | Source: Home / Self Care | Attending: Emergency Medicine

## 2021-04-21 ENCOUNTER — Observation Stay (HOSPITAL_COMMUNITY): Payer: Self-pay | Admitting: Certified Registered Nurse Anesthetist

## 2021-04-21 ENCOUNTER — Other Ambulatory Visit (HOSPITAL_COMMUNITY): Payer: Self-pay

## 2021-04-21 ENCOUNTER — Encounter (HOSPITAL_COMMUNITY): Payer: Self-pay

## 2021-04-21 HISTORY — PX: LAPAROSCOPIC APPENDECTOMY: SHX408

## 2021-04-21 LAB — HIV ANTIBODY (ROUTINE TESTING W REFLEX): HIV Screen 4th Generation wRfx: NONREACTIVE

## 2021-04-21 SURGERY — APPENDECTOMY, LAPAROSCOPIC
Anesthesia: General | Site: Abdomen

## 2021-04-21 MED ORDER — OXYCODONE HCL 5 MG PO TABS: 5.0000 mg | ORAL_TABLET | Freq: Once | ORAL | Status: DC | PRN

## 2021-04-21 MED ORDER — MEPERIDINE HCL 50 MG/ML IJ SOLN: 6.2500 mg | INTRAMUSCULAR | Status: DC | PRN

## 2021-04-21 MED ORDER — ONDANSETRON HCL 4 MG/2ML IJ SOLN
INTRAMUSCULAR | Status: AC
  Filled 2021-04-21: qty 2

## 2021-04-21 MED ORDER — PHENYLEPHRINE 40 MCG/ML (10ML) SYRINGE FOR IV PUSH (FOR BLOOD PRESSURE SUPPORT)
PREFILLED_SYRINGE | INTRAVENOUS | Status: DC | PRN
  Administered 2021-04-21: 80 ug via INTRAVENOUS
  Administered 2021-04-21: 40 ug via INTRAVENOUS

## 2021-04-21 MED ORDER — ALBUTEROL SULFATE HFA 108 (90 BASE) MCG/ACT IN AERS
INHALATION_SPRAY | RESPIRATORY_TRACT | Status: DC | PRN
  Administered 2021-04-21 (×2): 2 via RESPIRATORY_TRACT

## 2021-04-21 MED ORDER — KETOROLAC TROMETHAMINE 30 MG/ML IJ SOLN: 30.0000 mg | Freq: Once | INTRAMUSCULAR | Status: DC | PRN

## 2021-04-21 MED ORDER — FENTANYL CITRATE (PF) 100 MCG/2ML IJ SOLN
INTRAMUSCULAR | Status: DC | PRN
  Administered 2021-04-21 (×2): 50 ug via INTRAVENOUS

## 2021-04-21 MED ORDER — ALBUTEROL SULFATE HFA 108 (90 BASE) MCG/ACT IN AERS
INHALATION_SPRAY | RESPIRATORY_TRACT | Status: AC
  Filled 2021-04-21: qty 6.7

## 2021-04-21 MED ORDER — PROPOFOL 10 MG/ML IV BOLUS
INTRAVENOUS | Status: DC | PRN
  Administered 2021-04-21: 30 mg via INTRAVENOUS
  Administered 2021-04-21: 200 mg via INTRAVENOUS

## 2021-04-21 MED ORDER — BUPIVACAINE-EPINEPHRINE 0.25% -1:200000 IJ SOLN
INTRAMUSCULAR | Status: DC | PRN
  Administered 2021-04-21: 30 mL

## 2021-04-21 MED ORDER — TRAMADOL HCL 50 MG PO TABS
50.0000 mg | ORAL_TABLET | Freq: Four times a day (QID) | ORAL | 0 refills | Status: DC | PRN
  Filled 2021-04-21: qty 15, 4d supply, fill #0

## 2021-04-21 MED ORDER — AMISULPRIDE (ANTIEMETIC) 5 MG/2ML IV SOLN: 10.0000 mg | Freq: Once | INTRAVENOUS | Status: DC | PRN

## 2021-04-21 MED ORDER — KETOROLAC TROMETHAMINE 15 MG/ML IJ SOLN
INTRAMUSCULAR | Status: AC
  Administered 2021-04-21: 15 mg
  Filled 2021-04-21: qty 1

## 2021-04-21 MED ORDER — HYDROMORPHONE HCL 1 MG/ML IJ SOLN
INTRAMUSCULAR | Status: AC
  Filled 2021-04-21: qty 2

## 2021-04-21 MED ORDER — LACTATED RINGERS IR SOLN
Status: DC | PRN
  Administered 2021-04-21: 1

## 2021-04-21 MED ORDER — ONDANSETRON HCL 4 MG/2ML IJ SOLN
INTRAMUSCULAR | Status: DC | PRN
  Administered 2021-04-21: 4 mg via INTRAVENOUS

## 2021-04-21 MED ORDER — MIDAZOLAM HCL 5 MG/5ML IJ SOLN
INTRAMUSCULAR | Status: DC | PRN
  Administered 2021-04-21: 2 mg via INTRAVENOUS

## 2021-04-21 MED ORDER — PROMETHAZINE HCL 25 MG/ML IJ SOLN: 6.2500 mg | INTRAMUSCULAR | Status: DC | PRN

## 2021-04-21 MED ORDER — MIDAZOLAM HCL 2 MG/2ML IJ SOLN
INTRAMUSCULAR | Status: AC
  Filled 2021-04-21: qty 2

## 2021-04-21 MED ORDER — FENTANYL CITRATE (PF) 100 MCG/2ML IJ SOLN
INTRAMUSCULAR | Status: AC
  Filled 2021-04-21: qty 2

## 2021-04-21 MED ORDER — PROPOFOL 10 MG/ML IV BOLUS
INTRAVENOUS | Status: AC
  Filled 2021-04-21: qty 20

## 2021-04-21 MED ORDER — OXYCODONE HCL 5 MG/5ML PO SOLN
5.0000 mg | Freq: Once | ORAL | Status: DC | PRN
Start: 2021-04-21 — End: 2021-04-21

## 2021-04-21 MED ORDER — IBUPROFEN 200 MG PO TABS: 400.0000 mg | ORAL_TABLET | Freq: Four times a day (QID) | ORAL | Status: DC | PRN

## 2021-04-21 MED ORDER — PHENYLEPHRINE 40 MCG/ML (10ML) SYRINGE FOR IV PUSH (FOR BLOOD PRESSURE SUPPORT)
PREFILLED_SYRINGE | INTRAVENOUS | Status: AC
  Filled 2021-04-21: qty 10

## 2021-04-21 MED ORDER — COVID-19 AD26 VACCINE(JANSSEN) 0.5 ML IM SUSP: 0.5000 mL | Freq: Once | INTRAMUSCULAR | Status: DC

## 2021-04-21 MED ORDER — LIDOCAINE 2% (20 MG/ML) 5 ML SYRINGE
INTRAMUSCULAR | Status: DC | PRN
  Administered 2021-04-21: 100 mg via INTRAVENOUS

## 2021-04-21 MED ORDER — DEXAMETHASONE SODIUM PHOSPHATE 10 MG/ML IJ SOLN
INTRAMUSCULAR | Status: DC | PRN
  Administered 2021-04-21: 8 mg via INTRAVENOUS

## 2021-04-21 MED ORDER — SODIUM CHLORIDE 0.9 % IV SOLN
2.0000 g | INTRAVENOUS | Status: DC
  Filled 2021-04-21: qty 20

## 2021-04-21 MED ORDER — BUPIVACAINE-EPINEPHRINE (PF) 0.25% -1:200000 IJ SOLN
INTRAMUSCULAR | Status: AC
  Filled 2021-04-21: qty 30

## 2021-04-21 MED ORDER — HYDROMORPHONE HCL 1 MG/ML IJ SOLN
0.2500 mg | INTRAMUSCULAR | Status: DC | PRN
  Administered 2021-04-21 (×3): 0.5 mg via INTRAVENOUS

## 2021-04-21 MED ORDER — ROCURONIUM BROMIDE 10 MG/ML (PF) SYRINGE
PREFILLED_SYRINGE | INTRAVENOUS | Status: DC | PRN
  Administered 2021-04-21: 60 mg via INTRAVENOUS

## 2021-04-21 MED ORDER — KETOROLAC TROMETHAMINE 15 MG/ML IJ SOLN
INTRAMUSCULAR | Status: DC | PRN
  Administered 2021-04-21: 15 mg via INTRAVENOUS

## 2021-04-21 MED ORDER — SUGAMMADEX SODIUM 200 MG/2ML IV SOLN
INTRAVENOUS | Status: DC | PRN
  Administered 2021-04-21: 200 mg via INTRAVENOUS

## 2021-04-21 MED ORDER — 0.9 % SODIUM CHLORIDE (POUR BTL) OPTIME
TOPICAL | Status: DC | PRN
  Administered 2021-04-21: 1000 mL

## 2021-04-21 MED ORDER — METRONIDAZOLE 500 MG/100ML IV SOLN
500.0000 mg | Freq: Two times a day (BID) | INTRAVENOUS | Status: DC
  Administered 2021-04-21: 500 mg via INTRAVENOUS
  Filled 2021-04-21: qty 100

## 2021-04-21 MED ORDER — ACETAMINOPHEN 325 MG PO TABS: 650.0000 mg | ORAL_TABLET | Freq: Four times a day (QID) | ORAL | Status: DC | PRN

## 2021-04-21 MED ORDER — DEXAMETHASONE SODIUM PHOSPHATE 10 MG/ML IJ SOLN
INTRAMUSCULAR | Status: AC
  Filled 2021-04-21: qty 1

## 2021-04-21 SURGICAL SUPPLY — 35 items
APPLIER CLIP 5 13 M/L LIGAMAX5 (MISCELLANEOUS)
BAG COUNTER SPONGE SURGICOUNT (BAG) IMPLANT
CABLE HIGH FREQUENCY MONO STRZ (ELECTRODE) IMPLANT
CLIP APPLIE 5 13 M/L LIGAMAX5 (MISCELLANEOUS) IMPLANT
COVER SURGICAL LIGHT HANDLE (MISCELLANEOUS) ×2 IMPLANT
CUTTER FLEX LINEAR 45M (STAPLE) ×2 IMPLANT
DECANTER SPIKE VIAL GLASS SM (MISCELLANEOUS) ×2 IMPLANT
DERMABOND ADVANCED (GAUZE/BANDAGES/DRESSINGS) ×1
DERMABOND ADVANCED .7 DNX12 (GAUZE/BANDAGES/DRESSINGS) ×1 IMPLANT
GAUZE 4X4 16PLY ~~LOC~~+RFID DBL (SPONGE) ×2 IMPLANT
GLOVE SRG 8 PF TXTR STRL LF DI (GLOVE) ×1 IMPLANT
GLOVE SURG POLY ORTHO LF SZ7.5 (GLOVE) ×2 IMPLANT
GLOVE SURG UNDER POLY LF SZ8 (GLOVE) ×1
GOWN STRL REUS W/TWL XL LVL3 (GOWN DISPOSABLE) ×4 IMPLANT
GRASPER SUT TROCAR 14GX15 (MISCELLANEOUS) ×2 IMPLANT
IV LACTATED RINGERS 1000ML (IV SOLUTION) ×2 IMPLANT
KIT BASIN OR (CUSTOM PROCEDURE TRAY) ×2 IMPLANT
KIT TURNOVER KIT A (KITS) ×2 IMPLANT
PENCIL SMOKE EVACUATOR (MISCELLANEOUS) ×2 IMPLANT
POUCH RETRIEVAL ECOSAC 10 (ENDOMECHANICALS) ×1 IMPLANT
POUCH RETRIEVAL ECOSAC 10MM (ENDOMECHANICALS) ×1
RELOAD 45 VASCULAR/THIN (ENDOMECHANICALS) ×2 IMPLANT
RELOAD STAPLE TA45 3.5 REG BLU (ENDOMECHANICALS) IMPLANT
SET IRRIG TUBING LAPAROSCOPIC (IRRIGATION / IRRIGATOR) ×2 IMPLANT
SET TUBE SMOKE EVAC HIGH FLOW (TUBING) ×2 IMPLANT
SHEARS HARMONIC ACE PLUS 36CM (ENDOMECHANICALS) ×2 IMPLANT
SLEEVE XCEL OPT CAN 5 100 (ENDOMECHANICALS) ×2 IMPLANT
STRIP CLOSURE SKIN 1/2X4 (GAUZE/BANDAGES/DRESSINGS) IMPLANT
SUT MNCRL AB 4-0 PS2 18 (SUTURE) ×2 IMPLANT
SUT VIC AB 3-0 SH 18 (SUTURE) ×2 IMPLANT
SUT VICRYL 0 TIES 12 18 (SUTURE) ×2 IMPLANT
TOWEL OR 17X26 10 PK STRL BLUE (TOWEL DISPOSABLE) ×2 IMPLANT
TRAY LAPAROSCOPIC (CUSTOM PROCEDURE TRAY) ×2 IMPLANT
TROCAR BLADELESS OPT 5 100 (ENDOMECHANICALS) ×2 IMPLANT
TROCAR XCEL BLUNT TIP 100MML (ENDOMECHANICALS) ×2 IMPLANT

## 2021-04-21 NOTE — ED Notes (Signed)
Patient able to wipe self down with CHG wipes and brush teeth without assistance. Used interpreter to make sure patient understood surgery time and need for preparation. Denies any needs at this time. Belongings placed in bag at bedside including wallet in back pocket of pants

## 2021-04-21 NOTE — Interval H&P Note (Signed)
History and Physical Interval Note:  04/21/2021 11:05 AM  Spencer Whitaker  has presented today for surgery, with the diagnosis of ACUTE APPENDICITIS.  The various methods of treatment have been discussed with the patient and family. After consideration of risks, benefits and other options for treatment, the patient has consented to  Procedure(s): APPENDECTOMY LAPAROSCOPIC (N/A) as a surgical intervention.  The patient's history has been reviewed, patient examined, no change in status, stable for surgery.  I have reviewed the patient's chart and labs.  Questions were answered to the patient's satisfaction.    We discussed the etiology and management of acute appendicitis. We discussed operative and nonoperative management.  I recommended operative management along with IV antibiotics.  We discussed laparoscopic appendectomy. We discussed the risk and benefits of surgery including but not limited to bleeding, infection, injury to surrounding structures, need to convert to an open procedure, blood clot formation, post operative abscess or wound infection, staple line complications such as leak or bleeding, hernia formation, post operative ileus, need for additional procedures, anesthesia complications, and the typical postoperative course. I explained that the patient should expect a good improvement in their symptoms.  All of this was done with via video interpreter  Mary Sella. Andrey Campanile, MD, FACS General, Bariatric, & Minimally Invasive Surgery [facility] [place]  Gaynelle Adu

## 2021-04-21 NOTE — Progress Notes (Signed)
Pharmacy said no Covid vaccines for people who are not going to a facility. Will let the patient know.

## 2021-04-21 NOTE — Progress Notes (Signed)
Pt alert and oriented. Tolerating liquids, voiding. D/C instructions given via interpreter. Pt d/cd to home. Work note given.

## 2021-04-21 NOTE — Anesthesia Procedure Notes (Signed)
Procedure Name: Intubation Date/Time: 04/21/2021 11:57 AM Performed by: West Pugh, CRNA Pre-anesthesia Checklist: Patient identified, Emergency Drugs available, Suction available, Patient being monitored and Timeout performed Patient Re-evaluated:Patient Re-evaluated prior to induction Oxygen Delivery Method: Circle system utilized Preoxygenation: Pre-oxygenation with 100% oxygen Induction Type: IV induction Ventilation: Mask ventilation without difficulty Laryngoscope Size: Mac and 4 Grade View: Grade I Tube type: Oral Tube size: 7.5 mm Number of attempts: 1 Airway Equipment and Method: Stylet Placement Confirmation: ETT inserted through vocal cords under direct vision, positive ETCO2, CO2 detector and breath sounds checked- equal and bilateral Secured at: 22 cm Tube secured with: Tape Dental Injury: Teeth and Oropharynx as per pre-operative assessment

## 2021-04-21 NOTE — Op Note (Signed)
Spencer Whitaker [id number] April 15, 1976 04/21/2021  Appendectomy, Lap, Procedure Note  Indications: The patient presented with a history of right-sided abdominal pain. A CT revealed findings consistent with acute appendicitis.  Pre-operative Diagnosis: acute appendicitis  Post-operative Diagnosis: suppurative appendicitis  Surgeon: Gaynelle Adu MD FACS  Assistants: Aquilla Solian, [facility] resident PGY-3  Anesthesia: General endotracheal anesthesia   Procedure Details  The patient was seen again in the Holding Room. The risks, benefits, complications, treatment options, and expected outcomes were discussed with the patient and/or family. The possibilities of perforation of viscus, bleeding, recurrent infection, the need for additional procedures, failure to diagnose a condition, and creating a complication requiring transfusion or operation were discussed. There was concurrence with the proposed plan and informed consent was obtained. The site of surgery was properly noted. The patient was taken to Operating Room, identified as Spencer Whitaker and the procedure verified as Appendectomy. A Time Out was held and the above information confirmed.  The patient was placed in the supine position and general anesthesia was induced, along with placement of orogastric tube, SCDs. The patient voided prior to surgery. The abdomen was prepped and draped in a sterile fashion.  The patient had a pre-existing 1-1/2 cm fascial defect at his umbilicus so this defect was used to gain access to the abdomen.  A curvilinear infraumbilical incision was made with a #15 blade.  Dissection was carried down and the base of the umbilical stalk was taken off of the herniated preperitoneal fat.    A Kelly clamp was used to confirm entrance into the peritoneal cavity.  A pursestring suture was passed around the incision with a 0 Vicryl by the resident.  A 22mm Hasson was introduced into the abdomen and the  tails of the suture were used to hold the Hasson in place.   The pneumoperitoneum was then established to steady pressure of 15 mmHg.  Additional 5 mm cannulas then placed in the left lower quadrant of the abdomen and the suprapubic region under direct visualization by the resident. A careful evaluation of the entire abdomen was carried out. The patient was placed in Trendelenburg and left lateral decubitus position.  There was some purulent fluid in the right paracolic gutter.  The right colon was high in the right midabdomen so therefore the patient was actually placed back in supine position.  The small intestines were retracted in the cephalad and left lateral direction away from the pelvis and right lower quadrant. The patient was found to have an inflamed appendix that was extending behind the cecum in the right paracolic gutter. There was Whitaker evidence of perforation.  The appendix was carefully dissected. The appendix was was skeletonized with the harmonic scalpel by the resident.   While retracting on the appendix to get the stapler around its base the appendix became avulsed from the appendiceal stump.  There is a small cuff of appendix left.  I was able to grab it with a Art gallery manager and placed a endo-GIA stapler with a white load just below this.  The stapler was fired taking the remaining appendiceal stump and a small cuff of cecum.  Whitaker appendiceal stump was left in place.  There was Whitaker compromise of the ileocecal valve and terminal ileum.  The appendix was removed from the abdomen with an Ecco bag through the umbilical port by the resident.  There was Whitaker evidence of bleeding, leakage, or complication after division of the appendix. Irrigation was also performed and irrigate suctioned  from the abdomen as well.  The umbilical port site was closed with the purse string suture. The closure was viewed laparoscopically.  Since the patient had a pre-existing fascial defect at the umbilical fascia  the resident placed 2 additional interrupted 0 Vicryl's using the PMI suture passer with laparoscopic guidance.  Tthere was Whitaker residual palpable fascial defect.  Additional local was infiltrated.  The resident then tacked the base of the umbilical stalk back down to the umbilical fascia with an interrupted 3-0 Vicryl suture.  Deep dermis was then reapproximated with several interrupted 3-0 Vicryl sutures by the resident.  The trocar site skin wounds were closed with 4-0 Monocryl. Dermabond was applied to the skin incisions.  Instrument, sponge, and needle counts were correct at the conclusion of the case.   Findings: The appendix was found to be inflamed. There were not signs of necrosis.  There was not perforation. There was not abscess formation. There was some purulent fluid in the abdomen.   Estimated Blood Loss:  Minimal         Drains: none         Specimens: appendix         Complications:  None; patient tolerated the procedure well.         Disposition: PACU - hemodynamically stable.         Condition: stable  I was personally present & scrubbed during the entire procedure except for skin closure as documented in my operative note.  I performed certain parts of the procedure as documented in my note above   Mary Sella. Andrey Campanile, MD, FACS General, Bariatric, & Minimally Invasive Surgery [facility], [place]

## 2021-04-21 NOTE — Transfer of Care (Signed)
Immediate Anesthesia Transfer of Care Note  Patient: Spencer Whitaker  Procedure(s) Performed: APPENDECTOMY LAPAROSCOPIC (Abdomen)  Patient Location: PACU  Anesthesia Type:General  Level of Consciousness: awake, drowsy and patient cooperative  Airway & Oxygen Therapy: Patient Spontanous Breathing and Patient connected to face mask oxygen  Post-op Assessment: Report given to RN and Post -op Vital signs reviewed and stable  Post vital signs: Reviewed and stable  Last Vitals:  Vitals Value Taken Time  BP 133/85 04/21/21 1337  Temp 37 C 04/21/21 1337  Pulse 74 04/21/21 1345  Resp 16 04/21/21 1345  SpO2 100 % 04/21/21 1345  Vitals shown include unvalidated device data.  Last Pain:  Vitals:   04/21/21 1337  TempSrc:   PainSc: Asleep         Complications: No notable events documented.

## 2021-04-21 NOTE — Discharge Summary (Signed)
    Patient ID: Spencer Whitaker [id number] 1976-06-07 45 y.o.  Admit date: 04/20/2021 Discharge date: 04/21/2021  Admitting Diagnosis: Acute appendicitis  Discharge Diagnosis Patient Active Problem List   Diagnosis Date Noted   Appendicitis, acute 04/20/2021   Acute appendicitis 04/20/2021  S/p lap appy  Consultants none  Reason for Admission: Patient is a 45 year old Hispanic male who presents to the emergency department with sudden onset of right-sided abdominal pain approximately 3 PM today while at work.  Patient works in a Scientist, clinical (histocompatibility and immunogenetics).  Pain developed in the right lower abdomen and has been associated with nausea but no emesis.  Patient also notes pain at the umbilicus.  He has had no prior abdominal surgery.  In the emergency department, his white blood cell count was elevated at 18.8.  Patient underwent CT scan of the abdomen and pelvis which shows findings consistent with early acute appendicitis without complication.  Patient only speaks [place].  His entire interview was carried out with the assistance of a translator by telephone.  Patient denies any prescription medications.  He has no known drug allergies.  He is originally from [place].  Procedures Lap appy, Dr. Andrey Campanile 9/13   Hospital Course:  The patient was admitted and underwent a laparoscopic appendectomy.  The patient tolerated the procedure well.  On POD 0, the patient was tolerating a regular diet, voiding well, mobilizing, and pain was controlled with oral pain medications.  The patient was stable for DC home at this time with appropriate follow up made.   Allergies as of 04/21/2021   No Known Allergies      Medication List     TAKE these medications    acetaminophen 325 MG tablet Commonly known as: TYLENOL Take 2 tablets (650 mg total) by mouth every 6 (six) hours as needed for mild pain (or temp > 100).   ibuprofen 200 MG tablet Commonly known as: Motrin IB Take 2 tablets  (400 mg total) by mouth every 6 (six) hours as needed for moderate pain.   traMADol 50 MG tablet Commonly known as: Ultram Take 1 tablet (50 mg total) by mouth every 6 (six) hours as needed for moderate or severe pain.          Follow-up Information     [facility] up.   Specialty: General Surgery Why: [name] est organizando una cita de seguimiento en 3 a 4 semanas. Por favor llame para confirmar la fecha y hora de la cita. Llegue 30 minutos antes de la hora de la cita para English as a second language teacher. Contact information: [address] [place] [address] [phone]                 Signed: Barnetta Chapel, [facility] 04/21/2021, 4:12 PM Please see Amion for pager number during day hours 7:00am-4:30pm, 7-11:30am on Weekends

## 2021-04-21 NOTE — Discharge Instructions (Signed)
CIRUGIA LAPAROSCOPICA: INSTRUCCIONES DE POST OPERATORIO.  Revise siempre los documentos que le entreguen en el lugar donde se ha hecho la cirugia.  SI USTED NECESITA DOCUMENTOS DE INCAPACIDAD (DISABLE) O DE PERMISO FAMILAR (FAMILY LEAVE) NECESITA TRAERLOS A LA OFICINA PARA QUE SEAN PROCESADOS. NO  SE LOS DE A SU DOCTOR. A su alta del hospital se le dara una receta para controlar el dolor. Tomela como ha sido recetada, si la necesita. Si no la necesita puede tomar, Acetaminofen (Tylenol) o Ibuprofen (Advil) para aliviar dolor moderado. Continue tomando el resto de sus medicinas. Si necesita rellenar la receta, llame a la farmacia. ellos contactan a nuestra oficina pidiendo autorizacion. Este tipo de receta no pueden ser rellenadas despues de las  5pm o durante los fines de semana. Con relacion a la dieta: debe ser ligera los primeros dias despues que llege a la casa. Ejemplo: sopas y galleticas. Tome bastante liquido esos dias. La mayoria de los pacientes padecen de inflamacion y cambio de coloracion de la piel alrededor de las incisiones. esto toma dias en resolver.  pnerse una bolsa de hielo en el area affectada ayuda..  Es comun tambien tener un poco de estrenimiento si esta tomado medicinas para el dolor. incremente la cantidad de liquidos a tomar y puede tomar (Colace) esto previene el problema. Si ya tiene estrenimiento, es decir no ha defecado en 48 horas, puede tomar un laxativo (Milk of Magnesia or Miralax) uselo como el paquete le explica.  A menos que se le diga algo diferente. Remueva el bendaje a las 24-48 horas despues dela cirugia. y puede banarse en la ducha sin ningun problema. usted puede tener steri-strips (pequenas curitas transparentes en la piel puesta encima de la incision)  Estas banditas strips should be left on the skin for 7-10 days.   Si su cirujano puso pegamento encima de la incision usted puede banarse bajo la ducha en 24 horas. Este pegamento empezara a caerse en las  proximas 2-3 semanas. Si le pusieron suturas o presillas (grapos) estos seran quitados en su proxima cita en la oficina. . ACTIVIDADES:  Puede hacer actividad ligera.  Como caminar , subir escaleras y poco a poco irlas incrementando tanto como las tolere. Puede tener relaciones sexuales cuando sea comfortable. No carge objetos pesados o haga esfuerzos que no sean aprovados por su doctor. Puede manejar en cuanto no esta tomando medicamentos fuertes (narcoticos) para el dolor, pueda abrochar confortablemente el cinturon de seguridad, y pueda maniobrar y usar los pedales de su vehiculo con seguridad. PUEDE REGRESAR A TRABAJAR  Debe ver a su doctor para una cita de seguimiento en 2-3 semanas despues de la cirugia.   CUANDO LLAMAR A SU MEDICO: FIEBRE mayor de  101.0 No produccion de orina. Sangramiento continue de la herida Incremento de dolor, enrojecimientio o drenaje de la herida (incision) Incremento de dolor abdominal.  The clinic staff is available to answer your questions during regular business hours.  Please don't hesitate to call and ask to speak to one of the nurses for clinical concerns.  If you have a medical emergency, go to the nearest emergency room or call 911.  A surgeon from [facility] is always on call at the hospital. [address], [address], [place], [place]  [address] ? [address] [address], [place], [place]   [address] [phone] ? [phone] ? FAX [phone] Web site: [email or web address]  

## 2021-04-21 NOTE — Anesthesia Preprocedure Evaluation (Addendum)
Anesthesia Evaluation  Patient identified by MRN, date of birth, ID band Patient awake    Reviewed: Allergy & Precautions, NPO status , Patient's Chart, lab work & pertinent test results  Airway Mallampati: II  TM Distance: >3 FB Neck ROM: Full    Dental no notable dental hx. (+) Teeth Intact, Dental Advisory Given   Pulmonary Current Smoker and Patient abstained from smoking.,    Pulmonary exam normal breath sounds clear to auscultation       Cardiovascular Exercise Tolerance: Good Normal cardiovascular exam Rhythm:Regular Rate:Normal     Neuro/Psych    GI/Hepatic negative GI ROS, Neg liver ROS,   Endo/Other    Renal/GU Lab Results      Component                Value               Date                      CREATININE               0.86                04/20/2021                BUN                      15                  04/20/2021                NA                       142                 04/20/2021                K                        3.9                 04/20/2021                CL                       110                 04/20/2021                CO2                      23                  04/20/2021                Musculoskeletal   Abdominal   Peds  Hematology Lab Results      Component                Value               Date                      WBC                      18.8 (H)  04/20/2021                HGB                      15.8                04/20/2021                HCT                      44.9                04/20/2021                MCV                      89.6                04/20/2021                PLT                      319                 04/20/2021              Anesthesia Other Findings   Reproductive/Obstetrics                            Anesthesia Physical Anesthesia Plan  ASA: 2  Anesthesia Plan: General   Post-op Pain Management:     Induction: Intravenous  PONV Risk Score and Plan: 2 and Treatment may vary due to age or medical condition, Midazolam, Ondansetron and Dexamethasone  Airway Management Planned: Oral ETT  Additional Equipment: None  Intra-op Plan:   Post-operative Plan: Extubation in OR  Informed Consent: I have reviewed the patients History and Physical, chart, labs and discussed the procedure including the risks, benefits and alternatives for the proposed anesthesia with the patient or authorized representative who has indicated his/her understanding and acceptance.     Dental advisory given  Plan Discussed with: CRNA and Anesthesiologist  Anesthesia Plan Comments:         Anesthesia Quick Evaluation

## 2021-04-21 NOTE — Anesthesia Postprocedure Evaluation (Signed)
Anesthesia Post Note  Patient: Spencer Whitaker  Procedure(s) Performed: APPENDECTOMY LAPAROSCOPIC (Abdomen)     Patient location during evaluation: PACU Anesthesia Type: General Level of consciousness: awake and alert Pain management: pain level controlled Vital Signs Assessment: post-procedure vital signs reviewed and stable Respiratory status: spontaneous breathing, nonlabored ventilation, respiratory function stable and patient connected to nasal cannula oxygen Cardiovascular status: blood pressure returned to baseline and stable Postop Assessment: no apparent nausea or vomiting Anesthetic complications: no   No notable events documented.  Last Vitals:  Vitals:   04/21/21 1610 04/21/21 1730  BP: 130/75 132/80  Pulse: 76 81  Resp: 16 18  Temp: 37.1 C 36.4 C  SpO2: 95%     Last Pain:  Vitals:   04/21/21 1739  TempSrc:   PainSc: 6                  Trevor Iha

## 2021-04-22 ENCOUNTER — Encounter (HOSPITAL_COMMUNITY): Payer: Self-pay | Admitting: General Surgery

## 2021-04-22 LAB — SURGICAL PATHOLOGY

## 2021-04-30 ENCOUNTER — Other Ambulatory Visit (HOSPITAL_COMMUNITY): Payer: Self-pay

## 2022-07-18 IMAGING — CT CT ABD-PELV W/ CM
2 of 5 series · 16 of 46 positions shown, 18 images · IV contrast (omnipaque)
Comparison: May 09, 2020

CLINICAL DATA: Lower abdominal pain with nausea and vomiting.

EXAM:
CT ABDOMEN AND PELVIS WITH CONTRAST
TECHNIQUE: Multidetector CT imaging of the abdomen and pelvis was performed
using the standard protocol following bolus administration of
intravenous contrast.
CONTRAST:  80mL OMNIPAQUE IOHEXOL 350 MG/ML SOLN

[Series 2: axial st · axial · 0.84mm/px · z∈[-326,+94]mm · 13 of 100 slices shown, 15 images]
[im 8/100  soft-tissue]
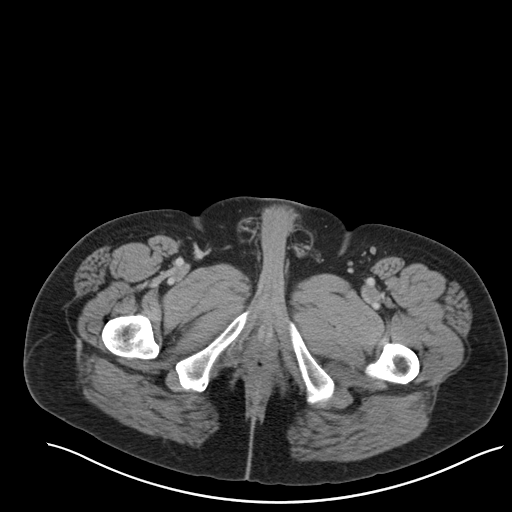
[im 8/100  bone]
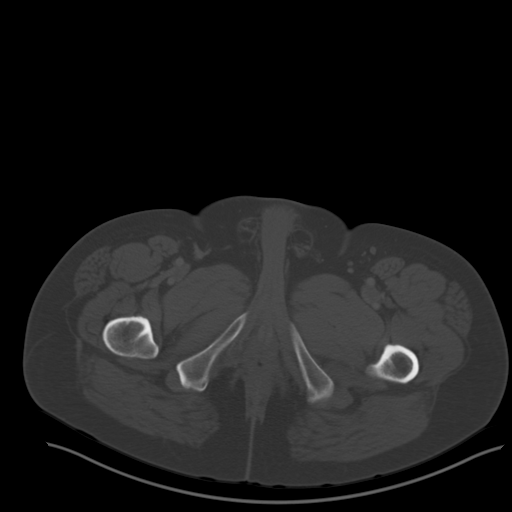
[im 15/100  soft-tissue]
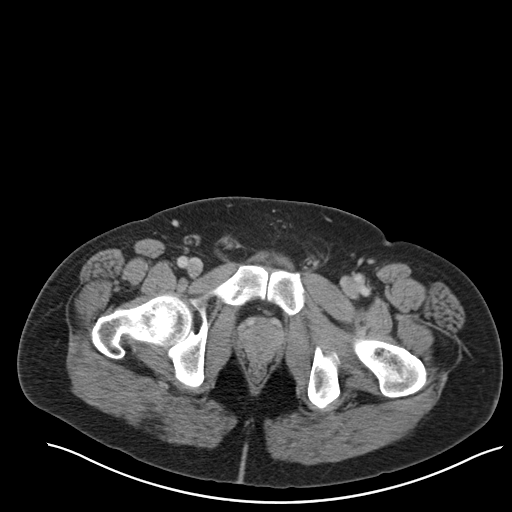
[im 22/100  soft-tissue]
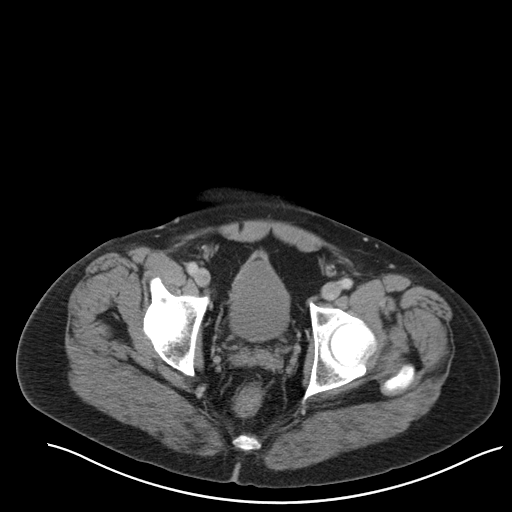
[im 29/100  soft-tissue]
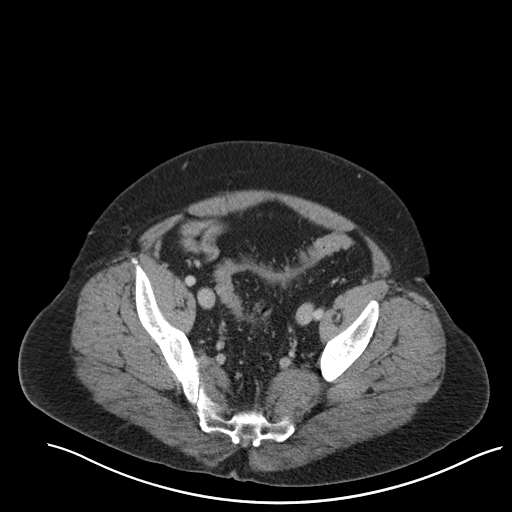
[im 36/100  soft-tissue]
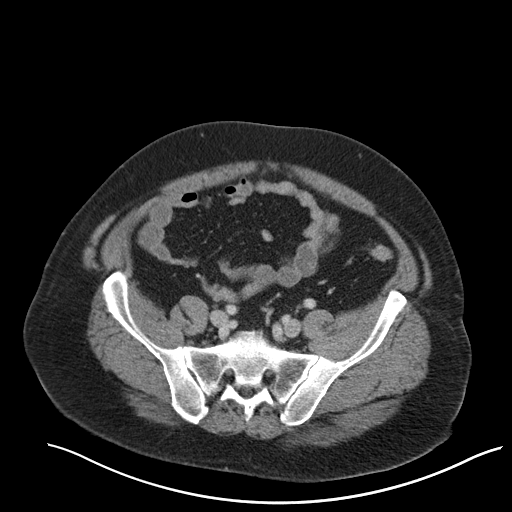
[im 43/100  soft-tissue]
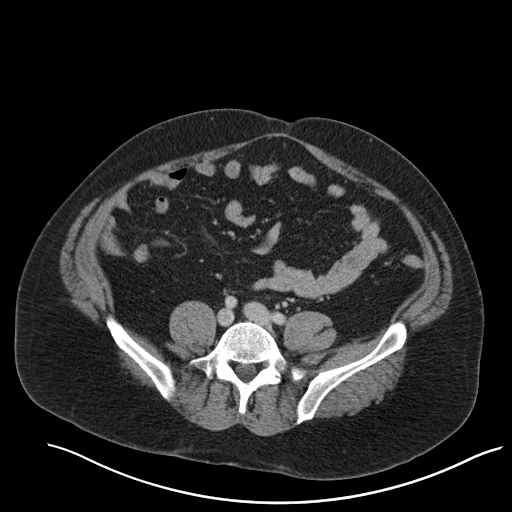
[im 50/100  soft-tissue]
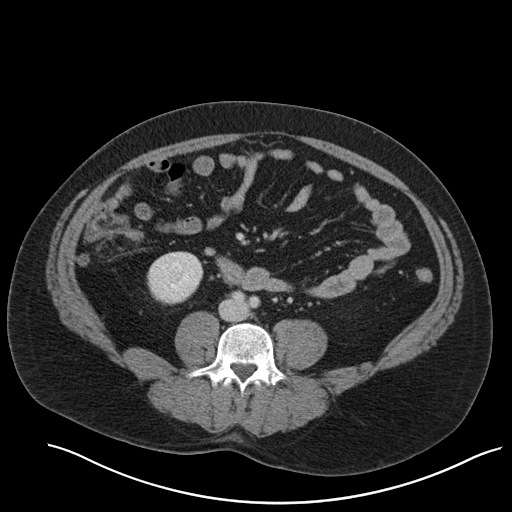
[im 57/100  soft-tissue]
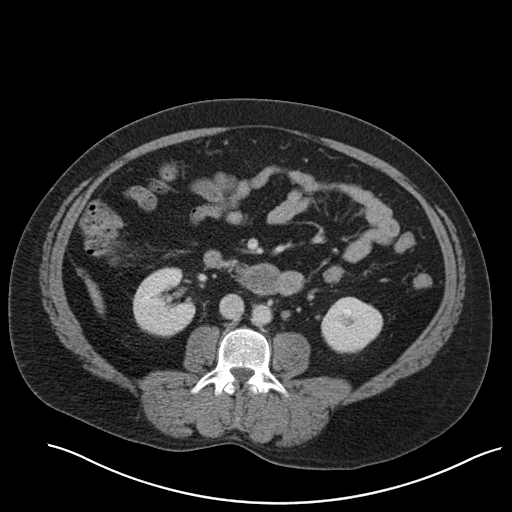
[im 64/100  soft-tissue]
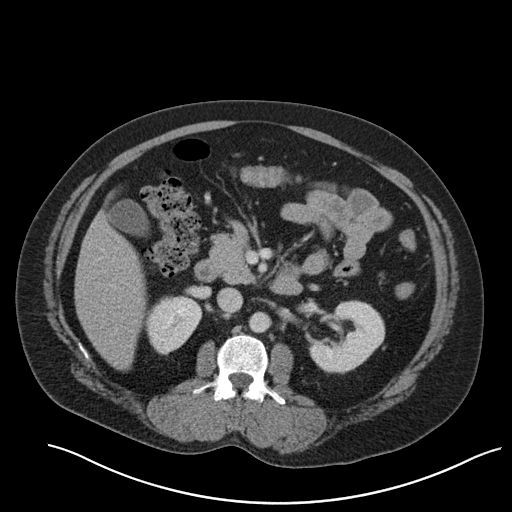
[im 64/100  bone]
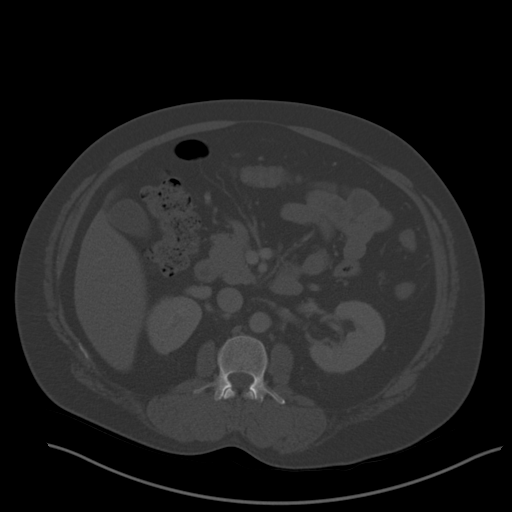
[im 71/100  soft-tissue]
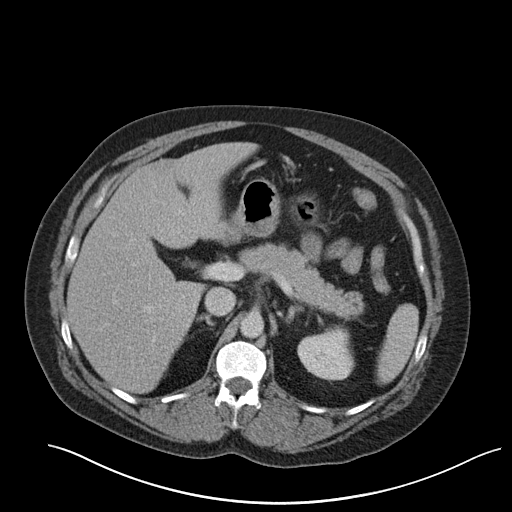
[im 78/100  soft-tissue]
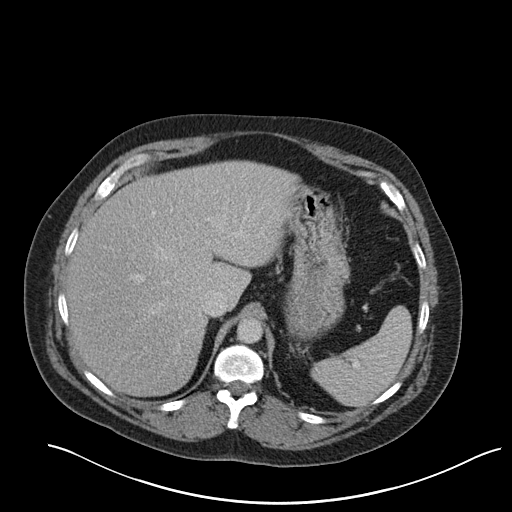
[im 85/100  soft-tissue]
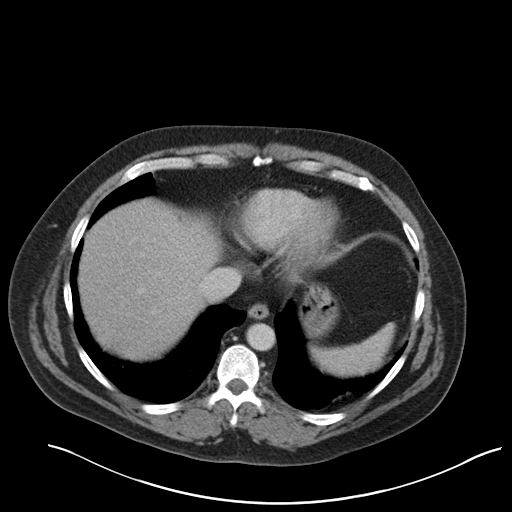
[im 92/100  soft-tissue]
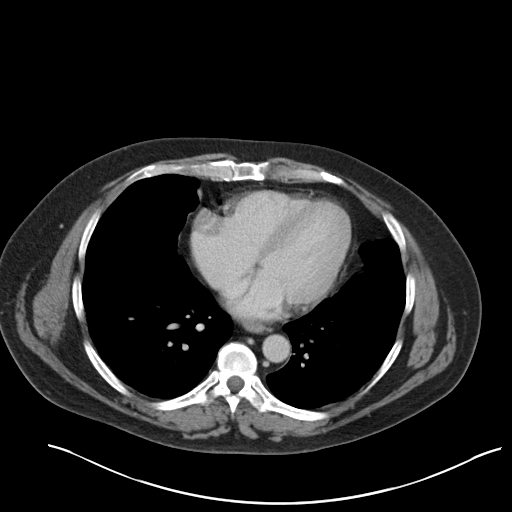

[Series 5: coronal st · coronal · 0.82mm/px · 3 of 157 slices shown]
[im 53/157  soft-tissue]
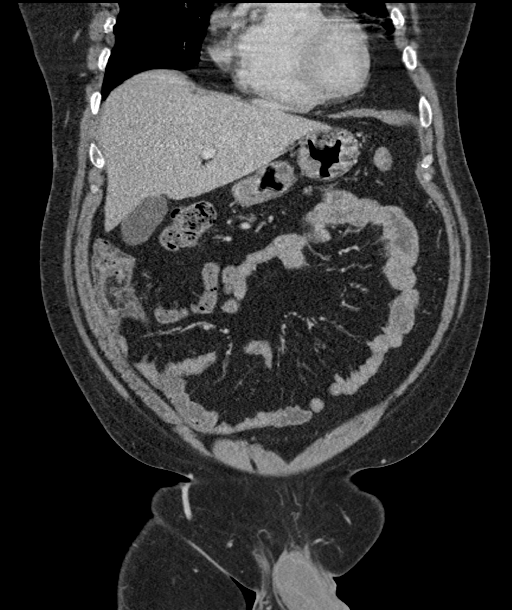
[im 70/157  soft-tissue]
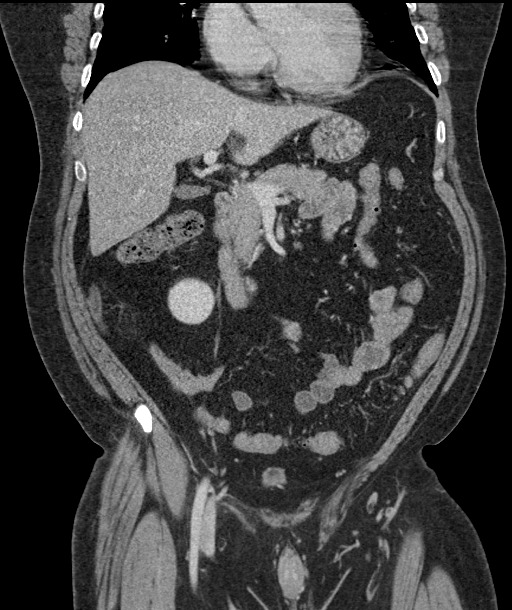
[im 87/157  soft-tissue]
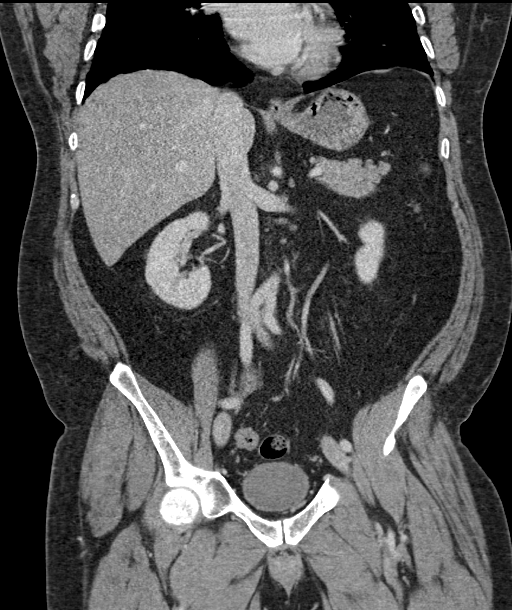

[16 of 46 positions shown; findings below may reference images not displayed]

FINDINGS: Lower chest: Mild atelectasis is seen within the bilateral lung
bases.

Hepatobiliary: No focal liver abnormality is seen. No gallstones,
gallbladder wall thickening, or biliary dilatation.

Pancreas: Unremarkable. No pancreatic ductal dilatation or
surrounding inflammatory changes.

Spleen: Normal in size without focal abnormality.

Adrenals/Urinary Tract: Adrenal glands are unremarkable. Kidneys are
normal, without renal calculi, focal lesion, or hydronephrosis.
Bladder is unremarkable.

Stomach/Bowel: Stomach is within normal limits. Mild, proximal
periappendiceal inflammatory fat stranding is seen. Mild thickening
of the proximal portion of the appendix is also noted. No evidence
of bowel dilatation. Noninflamed diverticula are seen throughout the
sigmoid colon.

Vascular/Lymphatic: No significant vascular findings are present. No
enlarged abdominal or pelvic lymph nodes.

Reproductive: Prostate is unremarkable.

Other: No abdominal wall hernia or abnormality. No abdominopelvic
ascites.

Musculoskeletal: No acute or significant osseous findings.
IMPRESSION: 1. Mild, uncomplicated appendicitis.
2. Sigmoid diverticulosis.

## 2023-02-16 ENCOUNTER — Other Ambulatory Visit: Payer: Self-pay

## 2023-02-16 ENCOUNTER — Emergency Department (HOSPITAL_COMMUNITY)
Admission: EM | Admit: 2023-02-16 | Discharge: 2023-02-17 | Disposition: A | Discharge status: Home / Self Care | Payer: Self-pay | Attending: Emergency Medicine | Admitting: Emergency Medicine

## 2023-02-16 ENCOUNTER — Emergency Department (HOSPITAL_COMMUNITY): Payer: Self-pay

## 2023-02-16 ENCOUNTER — Encounter (HOSPITAL_COMMUNITY): Payer: Self-pay

## 2023-02-16 DIAGNOSIS — M7989 Other specified soft tissue disorders: Secondary | ICD-10-CM | POA: Insufficient documentation

## 2023-02-16 DIAGNOSIS — R600 Localized edema: Secondary | ICD-10-CM | POA: Insufficient documentation

## 2023-02-16 DIAGNOSIS — R21 Rash and other nonspecific skin eruption: Secondary | ICD-10-CM | POA: Insufficient documentation

## 2023-02-16 DIAGNOSIS — K921 Melena: Secondary | ICD-10-CM | POA: Insufficient documentation

## 2023-02-16 LAB — CBC
HCT: 45.9 % (ref 39.0–52.0)
Hemoglobin: 15.8 g/dL (ref 13.0–17.0)
MCH: 30.7 pg (ref 26.0–34.0)
MCHC: 34.4 g/dL (ref 30.0–36.0)
MCV: 89.3 fL (ref 80.0–100.0)
Platelets: 309 10*3/uL (ref 150–400)
RBC: 5.14 MIL/uL (ref 4.22–5.81)
RDW: 12.6 % (ref 11.5–15.5)
WBC: 8.4 10*3/uL (ref 4.0–10.5)
nRBC: 0 % (ref 0.0–0.2)

## 2023-02-16 LAB — COMPREHENSIVE METABOLIC PANEL
ALT: 36 U/L (ref 0–44)
AST: 31 U/L (ref 15–41)
Albumin: 3.7 g/dL (ref 3.5–5.0)
Alkaline Phosphatase: 97 U/L (ref 38–126)
Anion gap: 8 (ref 5–15)
BUN: 12 mg/dL (ref 6–20)
CO2: 25 mmol/L (ref 22–32)
Calcium: 8.9 mg/dL (ref 8.9–10.3)
Chloride: 103 mmol/L (ref 98–111)
Creatinine, Ser: 0.8 mg/dL (ref 0.61–1.24)
GFR, Estimated: >60 mL/min (ref >60)
Glucose, Bld: 113 mg/dL — ABNORMAL HIGH (ref 70–99)
Potassium: 4.3 mmol/L (ref 3.5–5.1)
Sodium: 136 mmol/L (ref 135–145)
Total Bilirubin: 0.7 mg/dL (ref 0.3–1.2)
Total Protein: 7.1 g/dL (ref 6.5–8.1)

## 2023-02-16 LAB — TYPE AND SCREEN
ABO/RH(D): O POS
Antibody Screen: NEGATIVE

## 2023-02-16 LAB — ABO/RH: ABO/RH(D): O POS

## 2023-02-16 MED ORDER — DEXAMETHASONE SODIUM PHOSPHATE 10 MG/ML IJ SOLN
10.0000 mg | Freq: Once | INTRAMUSCULAR | Status: AC
  Administered 2023-02-16: 10 mg via INTRAVENOUS
  Filled 2023-02-16: qty 1

## 2023-02-16 MED ORDER — FAMOTIDINE IN NACL 20-0.9 MG/50ML-% IV SOLN
20.0000 mg | Freq: Once | INTRAVENOUS | Status: AC
  Administered 2023-02-16: 20 mg via INTRAVENOUS
  Filled 2023-02-16: qty 50

## 2023-02-16 MED ORDER — DIPHENHYDRAMINE HCL 50 MG/ML IJ SOLN
25.0000 mg | Freq: Once | INTRAMUSCULAR | Status: AC
  Administered 2023-02-16: 25 mg via INTRAVENOUS
  Filled 2023-02-16: qty 1

## 2023-02-16 NOTE — ED Triage Notes (Addendum)
Pt c/o int rash on backx2d. Pt states it's itching. Pt denies new meds or products. Pt c/o dark red blood in BM started today. Pt c/o pain in left knee and feet [id number]. Pt c/o tingling posterior of feet.  Pt was able to walk to triage from waiting room.

## 2023-02-16 NOTE — ED Provider Notes (Signed)
[place] EMERGENCY DEPARTMENT AT [facility] Provider Note   CSN: [id number] Arrival date & time: 02/16/23  1802     History  Chief Complaint  Patient presents with   multiple complaints    Spencer Whitaker is a 47 y.o. male with no significant past medical history presents the emergency department with multiple complaints.  Patient complaining of a rash on his entire body for the past 3 days. 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 Has tried a topical cream and "a pill" without relief. Rash started on his right arm and spread to his trunk and legs shortly after.   He is also complaining of dark red blood in a bowel movement earlier today, around 2pm. Describes it as small amount of "clear dark red blood". BM was not painful. No hx of same. Not on blood thinners.   Describing numbness in the soles of his feet with intermittent foot swelling x 3 months. Worse after standing during work.   Lastly, complaining of chest pain "on the side of his heart" with associated intermittent SOB x 3 months.  Has not seen PCP in 1 year or so.   The history is provided by the patient. The history is limited by a language barrier. A language interpreter was used.       Home Medications Prior to Admission medications   Medication Sig Start Date End Date Taking? Authorizing Provider  acetaminophen (TYLENOL) 325 MG tablet Take 2 tablets (650 mg total) by mouth every 6 (six) hours as needed for mild pain (or temp > 100). 04/21/21   Juliet Rude, PA-C  ibuprofen (MOTRIN IB) 200 MG tablet Take 2 tablets (400 mg total) by mouth every 6 (six) hours as needed for moderate pain. 04/21/21   Juliet Rude, PA-C  traMADol (ULTRAM) 50 MG tablet Take 1 tablet (50 mg total) by mouth every 6 (six) hours as needed for moderate or severe pain. 04/21/21   Juliet Rude, PA-C      Allergies    Patient has no known allergies.    Review of Systems   Review of  Systems  Respiratory:  Positive for shortness of breath.   Cardiovascular:  Positive for chest pain and leg swelling.  Gastrointestinal:  Positive for blood in stool.  Musculoskeletal:  Positive for arthralgias.  Skin:  Positive for rash.  All other systems reviewed and are negative.   Physical Exam Updated Vital Signs BP (!) 171/116 (BP Location: Right Arm)   Pulse 75   Temp 98 F (36.7 C) (Oral)   Resp (!) 21   Ht 5\' 7"  (1.702 m)   Wt 70 kg   SpO2 99%   BMI 24.17 kg/m  Physical Exam Vitals and nursing note reviewed.  Constitutional:      Appearance: Normal appearance.  HENT:     Head: Normocephalic and atraumatic.  Eyes:     Conjunctiva/sclera: Conjunctivae normal.  Cardiovascular:     Rate and Rhythm: Normal rate and regular rhythm.     Comments: Bilateral 1+ pitting edema to the level of the ankles Pulmonary:     Effort: Pulmonary effort is normal. No respiratory distress.     Breath sounds: Normal breath sounds.  Abdominal:     General: There is no distension.     Palpations: Abdomen is soft.     Tenderness: There is no abdominal tenderness.  Genitourinary:    Comments: GU exam declined Musculoskeletal:  Right lower leg: 1+ Pitting Edema present.     Left lower leg: 1+ Pitting Edema present.  Skin:    General: Skin is warm and dry.     Comments: Diffuse urticarial rash noted to the trunk and all extremities, with mild evidence of excoriation.  Not cellulitic.  Neurological:     General: No focal deficit present.     Mental Status: He is alert.     ED Results / Procedures / Treatments   Labs (all labs ordered are listed, but only abnormal results are displayed) Labs Reviewed  COMPREHENSIVE METABOLIC PANEL - Abnormal; Notable for the following components:      Result Value   Glucose, Bld 113 (*)    All other components within normal limits  CBC  BRAIN NATRIURETIC PEPTIDE  POC OCCULT BLOOD, ED  TYPE AND SCREEN  ABO/RH     EKG None  Radiology DG Chest 2 View  Result Date: 02/16/2023 CLINICAL DATA:  Chest pain and shortness of breath for 3 months. EXAM: CHEST - 2 VIEW COMPARISON:  05/09/2020 FINDINGS: Low lung volumes. The heart is upper normal in size, which may be accentuated by low volume technique. Chronic peribronchial thickening. No focal airspace disease, large pleural effusion or pneumothorax. No acute osseous abnormalities. IMPRESSION: 1. Low lung volumes with chronic peribronchial thickening. 2. Upper normal heart size, likely in part accentuated by low volume technique. Electronically Signed   By: Narda Rutherford M.D.   On: 02/16/2023 23:13    Procedures Procedures    Medications Ordered in ED Medications  diphenhydrAMINE (BENADRYL) injection 25 mg (25 mg Intravenous Given 02/16/23 2310)  famotidine (PEPCID) IVPB 20 mg premix (0 mg Intravenous Stopped 02/16/23 2356)  dexamethasone (DECADRON) injection 10 mg (10 mg Intravenous Given 02/16/23 2311)    ED Course/ Medical Decision Making/ A&P                             Medical Decision Making Amount and/or Complexity of Data Reviewed Labs: ordered. Radiology: ordered.  Risk Prescription drug management.  This patient is a 47 y.o. male  who presents to the ED for concern of rash, leg swelling, blood in BM.   Differential diagnoses prior to evaluation: The emergent differential diagnosis includes, but is not limited to,  urticaria, allergic reaction, SJS, TEN, CHF, GI bleed, constipation, hemorrhoids. This is not an exhaustive differential.   Past Medical History / Co-morbidities / Social History: Reports no PMH, appendectomy in 2022  Physical Exam: Physical exam performed. The pertinent findings include: Hypertensive, otherwise normal vital signs. No acute distress. Diffuse urticarial rash to trunk and extremities. No evidence of anaphylaxis. Bilateral 1+ pitting edema to the ankles.   Patient declined rectal exam.  Hemoccult not  obtained.  Patient states that he went to the bathroom and had a bowel movement without blood, so he is no longer concerned about this.  Lab Tests/Imaging studies: I personally interpreted labs/imaging and the pertinent results include:  CBC and CMP normal. BNP normal.  CXR with chronic peribronchial thickening, upper normal heart size, no effusion or consolidation.  I agree with the radiologist interpretation.  Cardiac monitoring: EKG obtained and interpreted by myself and attending physician which shows: Sinus rhythm with borderline ST changes   Medications: I ordered medication including decadron, benadryl, pepcid.  I have reviewed the patients home medicines and have made adjustments as needed.  Upon reevaluation patient states that the itching of his rash  is significantly improved and he is ready to go home.   Disposition: After consideration of the diagnostic results and the patients response to treatment, I feel that emergency department workup does not suggest an emergent condition requiring admission or immediate intervention beyond what has been performed at this time. The plan is: Discharged home with symptomatic management of rash likely allergic reaction in origin.  Encouraged continuing Benadryl as needed for itching, and following with PCP, possibly seeing an allergist.  Suspect that leg swelling is likely benign, could be related to patient's work and being on his feet.  Workup unremarkable for heart failure, does not otherwise clinically appear volume overloaded.  Advised wearing compression stockings.  Blood in stool has resolved as patient had a normal BM while in the ER.  He is hemodynamically stable with a normal hemoglobin and no abdominal pain, very low concern for acute GI bleed.  Advised him to hydrate well and take stool softeners as needed for possible constipation.  The patient is safe for discharge and has been instructed to return immediately for worsening symptoms, change  in symptoms or any other concerns.  Final Clinical Impression(s) / ED Diagnoses Final diagnoses:  Rash and nonspecific skin eruption  Leg swelling  Blood in stool    Rx / DC Orders ED Discharge Orders     None      Portions of this report may have been transcribed using voice recognition software. Every effort was made to ensure accuracy; however, inadvertent computerized transcription errors may be present.    Jeanella Flattery 02/17/23 0036    Mesner, Barbara Cower, MD 02/17/23 [phone]

## 2023-02-16 NOTE — ED Notes (Signed)
Blood sugar 113

## 2023-02-17 LAB — BRAIN NATRIURETIC PEPTIDE: B Natriuretic Peptide: 7.2 pg/mL (ref 0.0–100.0)

## 2023-02-17 NOTE — Discharge Instructions (Addendum)
You were seen in the emergency department for a rash, foot pain/swelling, and blood in your stool.  As we discussed your blood work looked very reassuring.  We gave you medication for an allergic reaction, and I am glad that this is helping with the itching.  You can continue taking Benadryl from the pharmacy as needed for itching.  You can use calamine lotion on the rash as well.  I believe your leg swelling is likely from being on your feet all day.  I recommend wearing compression stockings, and elevating your feet when you get home.  I am glad the blood in your stool has resolved.  Your blood counts looked normal.  Sometimes an episode of bloody stool can be after some constipation, so make sure that you are drinking plenty of fluids and you can take stool softeners as needed.  Please follow up with your primary care provider regarding your visit today. If you do not have a primary care provider, you may reach out to [facility] at [phone] to establish with one and make your first appointment.  Continue to monitor how you're doing and return to the ER for new or worsening symptoms. ______________________________________________________________________  Lo atendieron en el departamento de emergencias por sarpullido, dolor/hinchazn en el pie y [employer].  Como hablamos, su anlisis de sangre pareca muy tranquilizador.  Le dimos medicamentos para una reaccin alrgica y me alegra que esto le est ayudando con la picazn.  Puede seguir tomando Benadryl de la farmacia segn sea necesario para Advice worker.  Tambin puedes usar locin de calamina en la erupcin.  Creo que es probable que la hinchazn de tus piernas se deba a que ests de Astronomer.  Recomiendo usar medias de compresin y Librarian, academic al llegar a casa.  Me alegro que la sangre en tus heces se haya resuelto.  Tus recuentos sanguneos parecan normales.  A veces, un episodio de heces con  sangre puede ocurrir despus de un poco de estreimiento, as que asegrese de beber muchos lquidos y de tomar ablandadores de heces segn sea necesario.  Haga un seguimiento con su proveedor de atencin primaria con respecto a su visita de hoy. Si no tiene un proveedor de Marine scientist, puede comunicarse con [employer] [facility] al [phone] para establecer una cita con uno y programar su primera cita.  Contine controlando cmo se encuentra y regrese a la sala de emergencias si los sntomas aparecen o empeoran.

## 2023-02-19 ENCOUNTER — Emergency Department (HOSPITAL_COMMUNITY): Payer: Self-pay

## 2023-02-19 ENCOUNTER — Emergency Department (HOSPITAL_COMMUNITY)
Admission: EM | Admit: 2023-02-19 | Discharge: 2023-02-20 | Disposition: A | Discharge status: Home / Self Care | Payer: Self-pay | Attending: Emergency Medicine | Admitting: Emergency Medicine

## 2023-02-19 ENCOUNTER — Encounter (HOSPITAL_COMMUNITY): Payer: Self-pay | Admitting: *Deleted

## 2023-02-19 ENCOUNTER — Other Ambulatory Visit: Payer: Self-pay

## 2023-02-19 DIAGNOSIS — R21 Rash and other nonspecific skin eruption: Secondary | ICD-10-CM | POA: Insufficient documentation

## 2023-02-19 LAB — BASIC METABOLIC PANEL
Anion gap: 13 (ref 5–15)
BUN: 16 mg/dL (ref 6–20)
CO2: 25 mmol/L (ref 22–32)
Calcium: 9 mg/dL (ref 8.9–10.3)
Chloride: 100 mmol/L (ref 98–111)
Creatinine, Ser: 0.85 mg/dL (ref 0.61–1.24)
GFR, Estimated: >60 mL/min (ref >60)
Glucose, Bld: 112 mg/dL — ABNORMAL HIGH (ref 70–99)
Potassium: 4.7 mmol/L (ref 3.5–5.1)
Sodium: 138 mmol/L (ref 135–145)

## 2023-02-19 LAB — CBC
HCT: 48.1 % (ref 39.0–52.0)
Hemoglobin: 16.2 g/dL (ref 13.0–17.0)
MCH: 30.3 pg (ref 26.0–34.0)
MCHC: 33.7 g/dL (ref 30.0–36.0)
MCV: 90.1 fL (ref 80.0–100.0)
Platelets: 335 10*3/uL (ref 150–400)
RBC: 5.34 MIL/uL (ref 4.22–5.81)
RDW: 12.9 % (ref 11.5–15.5)
WBC: 11 10*3/uL — ABNORMAL HIGH (ref 4.0–10.5)
nRBC: 0 % (ref 0.0–0.2)

## 2023-02-19 LAB — TROPONIN I (HIGH SENSITIVITY): Troponin I (High Sensitivity): 5 ng/L (ref <18)

## 2023-02-19 NOTE — ED Triage Notes (Signed)
The pt is c/o everything on his body hurting throat pain chest pain both feet painful  and some lt leg numbness he was also here this past wedbesday for blisters on his lt thigh.  That area looks like bug bites redness  he has also had a headache and his throat is dry   he is asking for something to drink none given npo until he sees a doctor

## 2023-02-19 NOTE — ED Triage Notes (Signed)
The pt speaks only spanish intrepreter

## 2023-02-20 LAB — TROPONIN I (HIGH SENSITIVITY): Troponin I (High Sensitivity): 6 ng/L (ref <18)

## 2023-02-20 MED ORDER — DEXAMETHASONE SODIUM PHOSPHATE 10 MG/ML IJ SOLN
10.0000 mg | Freq: Once | INTRAMUSCULAR | Status: AC
  Administered 2023-02-20: 10 mg via INTRAMUSCULAR
  Filled 2023-02-20: qty 1

## 2023-02-20 MED ORDER — PREDNISONE 20 MG PO TABS: ORAL_TABLET | ORAL | 0 refills | Status: AC

## 2023-02-20 NOTE — Discharge Instructions (Signed)
Tus anlisis de [employer] servicio de urgencias de hoy estuvieron bien. Su radiografa de trax fue normal. Tome prednisona segn lo recetado y Clinical biochemist un seguimiento con un mdico de atencin primaria. Puede usar Benadryl si el sarpullido o la picazn continan a pesar de Software engineer. Regrese al servicio de urgencias si presenta sntomas nuevos o preocupantes.  Your blood tests in the ED today were okay. Your chest Xray was normal. Take prednisone as prescribed and follow up with a primary care doctor. You may use Benadryl if your rash or itching continue despite taking prednisone. Return to the ED for new or concerning symptoms.

## 2023-02-21 NOTE — ED Provider Notes (Signed)
[place] EMERGENCY DEPARTMENT AT [facility] Provider Note   CSN: [id number] Arrival date & time: 02/19/23  2018     History  Chief Complaint  Patient presents with   multiple complaints    Spencer Whitaker is a 47 y.o. male.  47 year old male presents to the ED for c/o rash. States rash has been present since his last ED visit and is persistent. It remains pruritic, intermittent without modifying factors; feels it is more prominent at night time. Has had some improvement with Benadryl, but the rash will recur when the medication wears off. No fevers, SOB, inability to swallow/drooling, new soaps/lotions/detergents or other topical contacts. No known recent viral illness or travel. Has not yet followed up with a PCP.  The history is provided by the patient. A language interpreter was used.       Home Medications Prior to Admission medications   Medication Sig Start Date End Date Taking? Authorizing Provider  predniSONE (DELTASONE) 20 MG tablet Take 2 tablets (40 mg total) by mouth daily for 4 days, THEN 1 tablet (20 mg total) daily for 4 days, THEN 0.5 tablets (10 mg total) daily for 4 days. Take 40 mg by mouth daily for 3 days, then 20mg  by mouth daily for 3 days, then 10mg  daily for 3 days. 02/20/23 03/04/23 Yes Antony Madura, PA-C  acetaminophen (TYLENOL) 325 MG tablet Take 2 tablets (650 mg total) by mouth every 6 (six) hours as needed for mild pain (or temp > 100). 04/21/21   Juliet Rude, PA-C  ibuprofen (MOTRIN IB) 200 MG tablet Take 2 tablets (400 mg total) by mouth every 6 (six) hours as needed for moderate pain. 04/21/21   Juliet Rude, PA-C  traMADol (ULTRAM) 50 MG tablet Take 1 tablet (50 mg total) by mouth every 6 (six) hours as needed for moderate or severe pain. 04/21/21   Juliet Rude, PA-C      Allergies    Patient has no known allergies.    Review of Systems   Review of Systems Ten systems reviewed and are negative for acute change,  except as noted in the HPI.    Physical Exam Updated Vital Signs BP (!) 151/98   Pulse 83   Temp 97.8 F (36.6 C) (Oral)   Resp 16   Ht 5\' 7"  (1.702 m)   Wt 70 kg   SpO2 100%   BMI 24.17 kg/m   Physical Exam Vitals and nursing note reviewed.  Constitutional:      General: He is not in acute distress.    Appearance: He is well-developed. He is not diaphoretic.  HENT:     Head: Normocephalic and atraumatic.     Mouth/Throat:     Comments: No evidence of perioral involvement. Tolerating secretions w/o difficulty. Normal phonation. Eyes:     General: No scleral icterus.    Conjunctiva/sclera: Conjunctivae normal.  Cardiovascular:     Rate and Rhythm: Normal rate.     Pulses: Normal pulses.  Pulmonary:     Effort: Pulmonary effort is normal. No respiratory distress.     Breath sounds: No stridor. No wheezing.     Comments: Respirations even and unlabored. Lungs CTAB. Musculoskeletal:        General: Normal range of motion.     Cervical back: Normal range of motion.  Skin:    General: Skin is warm and dry.     Coloration: Skin is not pale.  Findings: Rash present. No erythema.     Comments: Urticarial appearing rash, noted mostly to proximal thighs, proximal arms and upper chest, face. No distinct herald patch. Erythema is blanching. No induration, skin sloughing, purulence or weeping.  Neurological:     Mental Status: He is alert and oriented to person, place, and time.  Psychiatric:        Behavior: Behavior normal.     ED Results / Procedures / Treatments   Labs (all labs ordered are listed, but only abnormal results are displayed) Labs Reviewed  BASIC METABOLIC PANEL - Abnormal; Notable for the following components:      Result Value   Glucose, Bld 112 (*)    All other components within normal limits  CBC - Abnormal; Notable for the following components:   WBC 11.0 (*)    All other components within normal limits  TROPONIN I (HIGH SENSITIVITY)  TROPONIN  I (HIGH SENSITIVITY)    EKG EKG Interpretation Date/Time:  Saturday February 19 2023 21:38:56 EDT Ventricular Rate:  83 PR Interval:  132 QRS Duration:  86 QT Interval:  356 QTC Calculation: 418 R Axis:   47  Text Interpretation: Normal sinus rhythm Normal ECG When compared with ECG of 17-Feb-2023 00:00, PREVIOUS ECG IS PRESENT Confirmed by Gwyneth Sprout ([id number]) on 02/20/2023 10:16:30 PM  Radiology DG Chest 2 View  Result Date: 02/19/2023 CLINICAL DATA:  Chest pain EXAM: CHEST - 2 VIEW COMPARISON:  02/16/2023 FINDINGS: The heart size and mediastinal contours are within normal limits. Both lungs are clear. The visualized skeletal structures are unremarkable. IMPRESSION: No active cardiopulmonary disease. Electronically Signed   By: Alcide Clever M.D.   On: 02/19/2023 22:03    Procedures Procedures    Medications Ordered in ED Medications  dexamethasone (DECADRON) injection 10 mg (10 mg Intramuscular Given 02/20/23 0051)    ED Course/ Medical Decision Making/ A&P                             Medical Decision Making Risk Prescription drug management.   This patient presents to the ED for concern of rash, this involves an extensive number of treatment options, and is a complaint that carries with it a high risk of complications and morbidity.  The differential diagnosis includes urticaria vs dermatitis vs secondary syphilis vs HFM vs SJS vs EMM vs pityriasis rosea   Co morbidities that complicate the patient evaluation  None known   Additional history obtained:  External records from outside source obtained and reviewed including normal CBC and CMP in the ED 3 days ago. Normal platelets. Negative CXR.   Lab Tests:  I Ordered, and personally interpreted labs.  The pertinent results include:  WBC 11.0 (nonspecific). Negative troponin x2   Imaging Studies ordered:  I ordered imaging studies including CXR  I independently visualized and interpreted imaging which showed  no acute cardiopulmonary abnormality I agree with the radiologist interpretation   Cardiac Monitoring:  The patient was maintained on a cardiac monitor.  I personally viewed and interpreted the cardiac monitored which showed an underlying rhythm of: NSR   Medicines ordered and prescription drug management:  I ordered medication including Decadron for persistent rash  Reevaluation of the patient after these medicines showed that the patient  remained stable I have reviewed the patients home medicines and have made adjustments as needed   Test Considered:  Respiratory viral panel to r/o viral exanthem   Problem List /  ED Course:  Rash appears c/w urticaria; intermittent improvement with Benadryl. Unclear inciting cause. No symptoms or signs to suggest anaphylaxis at this time. Lungs CTAB on exam. Normal phonation and tolerating secretions. No involvement of the buccal mucosa. No conjunctival injection. Rash not c/w scarlet fever or pityriasis; no Herald patch noted. Question erythema multiforme, but certainly no physical exam findings c/w SJS, skin scalded syndrome. No evidence of secondary infection or cellulitis. Palm and sole sparing. No fevers. Doubt RMSF, syphilis. Preserved platelets and kidney function. Rash is not c/w purpura or petechiae. Doubt vasculitic process.   Reevaluation:  After the interventions noted above, I reevaluated the patient and found that they have :stayed the same   Social Determinants of Health:  Language barrier   Dispostion:  After consideration of the diagnostic results and the patients response to treatment, I feel that the patent would benefit from outpatient PCP f/u. Stable for discharge. Trial steroid taper given chronicity. Return precautions discussed and provided. Patient discharged in stable condition with no unaddressed concerns.         Final Clinical Impression(s) / ED Diagnoses Final diagnoses:  Rash and nonspecific skin  eruption    Rx / DC Orders ED Discharge Orders          Ordered    predniSONE (DELTASONE) 20 MG tablet  Daily        02/20/23 0153              Antony Madura, PA-C 02/21/23 1601    Tilden Fossa, MD 02/21/23 2246

## 2023-03-08 ENCOUNTER — Telehealth: Payer: Self-pay

## 2023-03-08 NOTE — Telephone Encounter (Signed)
Patient just turned in his prescription, he did not realize he had one until a friend pointed it out. There are two sets of taper on the script the first one will be taken as it aligned with the tablet quantity.

## 2023-03-17 ENCOUNTER — Other Ambulatory Visit (HOSPITAL_COMMUNITY): Payer: Self-pay

## 2023-03-17 ENCOUNTER — Encounter (HOSPITAL_COMMUNITY): Payer: Self-pay

## 2023-03-17 ENCOUNTER — Ambulatory Visit (HOSPITAL_COMMUNITY)
Admission: EM | Admit: 2023-03-17 | Discharge: 2023-03-17 | Disposition: A | Discharge status: Home / Self Care | Payer: Self-pay | Attending: Family Medicine | Admitting: Family Medicine

## 2023-03-17 DIAGNOSIS — K921 Melena: Secondary | ICD-10-CM | POA: Insufficient documentation

## 2023-03-17 DIAGNOSIS — L309 Dermatitis, unspecified: Secondary | ICD-10-CM | POA: Insufficient documentation

## 2023-03-17 LAB — COMPREHENSIVE METABOLIC PANEL
ALT: 27 U/L (ref 0–44)
AST: 22 U/L (ref 15–41)
Albumin: 3.9 g/dL (ref 3.5–5.0)
Alkaline Phosphatase: 79 U/L (ref 38–126)
Anion gap: 11 (ref 5–15)
BUN: 10 mg/dL (ref 6–20)
CO2: 26 mmol/L (ref 22–32)
Calcium: 9 mg/dL (ref 8.9–10.3)
Chloride: 98 mmol/L (ref 98–111)
Creatinine, Ser: 1.02 mg/dL (ref 0.61–1.24)
GFR, Estimated: >60 mL/min (ref >60)
Glucose, Bld: 110 mg/dL — ABNORMAL HIGH (ref 70–99)
Potassium: 3.5 mmol/L (ref 3.5–5.1)
Sodium: 135 mmol/L (ref 135–145)
Total Bilirubin: 0.7 mg/dL (ref 0.3–1.2)
Total Protein: 7.2 g/dL (ref 6.5–8.1)

## 2023-03-17 LAB — CBC
HCT: 47.6 % (ref 39.0–52.0)
Hemoglobin: 16.7 g/dL (ref 13.0–17.0)
MCH: 31.2 pg (ref 26.0–34.0)
MCHC: 35.1 g/dL (ref 30.0–36.0)
MCV: 89 fL (ref 80.0–100.0)
Platelets: 356 10*3/uL (ref 150–400)
RBC: 5.35 MIL/uL (ref 4.22–5.81)
RDW: 12.6 % (ref 11.5–15.5)
WBC: 10.5 10*3/uL (ref 4.0–10.5)
nRBC: 0 % (ref 0.0–0.2)

## 2023-03-17 MED ORDER — PREDNISONE 20 MG PO TABS
40.0000 mg | ORAL_TABLET | Freq: Every day | ORAL | 0 refills | Status: AC
  Filled 2023-03-17: qty 10, 5d supply, fill #0

## 2023-03-17 NOTE — ED Provider Notes (Signed)
[facility]    CSN: [id number] Arrival date & time: 03/17/23  1129      History   Chief Complaint No chief complaint on file.   HPI Spencer Whitaker is a 47 y.o. male.   HPI Here for blood in his stool.  He had a loose stool last night and again this morning and had blood in it.  There was not as much this morning.  No nausea or vomiting or fever.  He also notes a rash that has been coming and going.  It has come back in the last 2 to 3 days and is very pruritic.  It is on his right leg.  He has been seen for it in the ER twice this summer.   Labs are unrevealing.  He does not have a primary care provider here in the state Past Medical History:  Diagnosis Date   Weight loss    Pt states he has taken weight loss treatment in the form of injections and pills to lose weight    Patient Active Problem List   Diagnosis Date Noted   Appendicitis, acute 04/20/2021   Acute appendicitis 04/20/2021    Past Surgical History:  Procedure Laterality Date   LAPAROSCOPIC APPENDECTOMY N/A 04/21/2021   Procedure: APPENDECTOMY LAPAROSCOPIC;  Surgeon: Gaynelle Adu, MD;  Location: [facility];  Service: General;  Laterality: N/A;   NO PAST SURGERIES         Home Medications    Prior to Admission medications   Medication Sig Start Date End Date Taking? Authorizing Provider  predniSONE (DELTASONE) 20 MG tablet Take 2 tablets (40 mg total) by mouth daily with breakfast for 5 days. 03/17/23 03/22/23 Yes Zenia Resides, MD    Family History History reviewed. No pertinent family history.  Social History Social History   Tobacco Use   Smoking status: Every Day    Current packs/day: 0.50    Types: Cigarettes   Smokeless tobacco: Never  Vaping Use   Vaping status: Never Used  Substance Use Topics   Alcohol use: Yes    Comment: more than 15 beers some days and some days less   Drug use: Never     Allergies   Patient has no known allergies.   Review of  Systems Review of Systems   Physical Exam Triage Vital Signs ED Triage Vitals  Encounter Vitals Group     BP 03/17/23 1217 (!) 153/91     Systolic BP Percentile --      Diastolic BP Percentile --      Pulse Rate 03/17/23 1217 88     Resp 03/17/23 1217 18     Temp 03/17/23 1217 98 F (36.7 C)     Temp Source 03/17/23 1217 Oral     SpO2 03/17/23 1217 96 %     Weight --      Height --      Head Circumference --      Peak Flow --      Pain Score 03/17/23 1210 0     Pain Loc --      Pain Education --      Exclude from Growth Chart --    No data found.  Updated Vital Signs BP (!) 153/91 (BP Location: Right Arm)   Pulse 88   Temp 98 F (36.7 C) (Oral)   Resp 18   SpO2 96%   Visual Acuity Right Eye Distance:   Left Eye Distance:   Bilateral  Distance:    Right Eye Near:   Left Eye Near:    Bilateral Near:     Physical Exam   UC Treatments / Results  Labs (all labs ordered are listed, but only abnormal results are displayed) Labs Reviewed  CBC  COMPREHENSIVE METABOLIC PANEL    EKG   Radiology No results found.  Procedures Procedures (including critical care time)  Medications Ordered in UC Medications - No data to display  Initial Impression / Assessment and Plan / UC Course  I have reviewed the triage vital signs and the nursing notes.  Pertinent labs & imaging results that were available during my care of the patient were reviewed by me and considered in my medical decision making (see chart for details).       CBC and CMP are drawn today and we will notify him if anything is significantly abnormal.  Prednisone was sent in for the dermatitis.  Staff will assist him in helping him find a primary care provider Final Clinical Impressions(s) / UC Diagnoses   Final diagnoses:  Blood in stool  Dermatitis     Discharge Instructions      We have drawn blood to check your blood counts, sodium and potassium and kidney and liver function  numbers.  That will notify if anything is significantly abnormal. (Le hemos extrado sangre para comprobar sus recuentos sanguneos, sodio y potasio y cifras de funcin renal y heptica.  Eso notificar si algo es significativamente anormal.)  Take prednisone 20 mg--2 daily for 5 days  (Tome 2 tabletas por la boca diaramente por 5 dias)   If the bleeding becomes very brisk, go to the emergency room for further evaluation.(Si el sangrado se vuelve muy intenso, acuda a la sala de emergencias para Hotel manager.)      ED Prescriptions     Medication Sig Dispense Auth. Provider   predniSONE (DELTASONE) 20 MG tablet Take 2 tablets (40 mg total) by mouth daily with breakfast for 5 days. 10 tablet Marlinda Mike Janace Aris, MD      PDMP not reviewed this encounter.   Zenia Resides, MD 03/17/23 1300

## 2023-03-17 NOTE — ED Triage Notes (Addendum)
Pt reports when he went to the bathroom he noticed blood in his stool x 1 day.    Bilateral foot numbness and heat. Headache and stomach ache   Pt insist there is a Psychologist, [place] and wants his meds to go there but there is not one.

## 2023-03-17 NOTE — Discharge Instructions (Addendum)
We have drawn blood to check your blood counts, sodium and potassium and kidney and liver function numbers.  That will notify if anything is significantly abnormal. (Le hemos extrado sangre para comprobar sus recuentos sanguneos, sodio y potasio y cifras de funcin renal y heptica.  Eso notificar si algo es significativamente anormal.)  Take prednisone 20 mg--2 daily for 5 days  (Tome 2 tabletas por la boca diaramente por 5 dias)   If the bleeding becomes very brisk, go to the emergency room for further evaluation.(Si el sangrado se vuelve muy intenso, acuda a la sala de emergencias para una evaluacin adicional.)

## 2023-11-28 ENCOUNTER — Ambulatory Visit (INDEPENDENT_AMBULATORY_CARE_PROVIDER_SITE_OTHER): Payer: Self-pay | Admitting: Primary Care

## 2023-11-28 ENCOUNTER — Other Ambulatory Visit (HOSPITAL_COMMUNITY): Payer: Self-pay

## 2023-11-28 ENCOUNTER — Encounter (INDEPENDENT_AMBULATORY_CARE_PROVIDER_SITE_OTHER): Payer: Self-pay | Admitting: Primary Care

## 2023-11-28 VITALS — BP 146/94 | HR 77 | Resp 16 | Ht 66.0 in | Wt 221.8 lb

## 2023-11-28 DIAGNOSIS — Z1159 Encounter for screening for other viral diseases: Secondary | ICD-10-CM

## 2023-11-28 DIAGNOSIS — G629 Polyneuropathy, unspecified: Secondary | ICD-10-CM

## 2023-11-28 DIAGNOSIS — Z13220 Encounter for screening for lipoid disorders: Secondary | ICD-10-CM

## 2023-11-28 DIAGNOSIS — R7303 Prediabetes: Secondary | ICD-10-CM

## 2023-11-28 DIAGNOSIS — Z2821 Immunization not carried out because of patient refusal: Secondary | ICD-10-CM

## 2023-11-28 DIAGNOSIS — E6609 Other obesity due to excess calories: Secondary | ICD-10-CM

## 2023-11-28 DIAGNOSIS — Z6835 Body mass index (BMI) 35.0-35.9, adult: Secondary | ICD-10-CM

## 2023-11-28 DIAGNOSIS — Z7689 Persons encountering health services in other specified circumstances: Secondary | ICD-10-CM

## 2023-11-28 DIAGNOSIS — E66812 Obesity, class 2: Secondary | ICD-10-CM

## 2023-11-28 DIAGNOSIS — Z1211 Encounter for screening for malignant neoplasm of colon: Secondary | ICD-10-CM

## 2023-11-28 DIAGNOSIS — I10 Essential (primary) hypertension: Secondary | ICD-10-CM

## 2023-11-28 LAB — POCT GLYCOSYLATED HEMOGLOBIN (HGB A1C): HbA1c POC (<> result, manual entry): 5.9 % (ref 4.0–5.6)

## 2023-11-28 MED ORDER — LISINOPRIL 20 MG PO TABS
20.0000 mg | ORAL_TABLET | Freq: Every day | ORAL | 1 refills | Status: DC
  Filled 2023-11-28: qty 90, 90d supply, fill #0

## 2023-11-28 MED ORDER — GABAPENTIN 100 MG PO CAPS: 100.0000 mg | ORAL_CAPSULE | Freq: Three times a day (TID) | ORAL | 0 refills | Status: DC

## 2023-11-28 NOTE — Patient Instructions (Addendum)
 Lisinopril  Tablets Qu es este medicamento? El LISINOPRIL  se usa  para tratar la presin arterial alta y la insuficiencia cardiaca. Tambin se puede usar para prevenir ms daos despus de un ataque cardiaco. Acta relajando los vasos sanguneos y as se reduce el trabajo que debe realizar el corazn. Pertenece a un grupo de medicamentos llamados inhibidores de la ECA. Este medicamento puede ser utilizado para otros usos; si tiene alguna pregunta consulte con su proveedor de atencin mdica o con su farmacutico. MARCAS COMUNES: Prinivil , Zestril  Qu le debo informar a mi profesional de la salud antes de tomar este medicamento? Necesitan saber si usted presenta alguno de los [facility] o situaciones: Diabetes Enfermedad vascular o cardiaca Antecedentes de inflamacin de la lengua, el rostro o los labios con dificultad para Industrial/product designer, dificultad para tragar, ronquera o estrechamiento de la garganta (angioedema) Enfermedad renal Presin arterial baja Una reaccin alrgica o inusual al lisinopril , al veneno de insectos, a otros medicamentos, alimentos, colorantes o conservantes Si est embarazada o buscando quedar embarazada Si est amamantando a un beb Cmo debo utilizar este medicamento? Tome este medicamento por va oral. Use el medicamento segn las instrucciones en la etiqueta a la misma hora todos [place]. Puede tomarlo con o sin alimentos. Si el Social worker, tmelo con alimentos. Siga usndolo a menos que su equipo de atencin le indique dejar de Media planner. Hable con su equipo de atencin sobre el uso de este medicamento en nios. Aunque se puede recetar a nios tan pequeos como de 6 aos de edad con ciertas afecciones, existen precauciones que deben tomarse. Sobredosis: Pngase en contacto inmediatamente con un centro toxicolgico o una sala de urgencia si usted cree que haya tomado demasiado medicamento.<br>ATENCIN: [employer] es solo para  usted. No comparta este medicamento con nadie. Qu sucede si me olvido de una dosis? Si olvida una dosis, adminstrela lo antes posible. Si es casi la hora de la prxima dosis, administre solo esa dosis. No se administre dosis adicionales o dobles. Qu puede interactuar con este medicamento? No use este medicamento con ninguno de los siguientes productos: Veneno de himenpteros Sacubitril; valsartn Este medicamento tambin podra interactuar con los siguientes productos: [place] Bloqueadores del receptor de angiotensina, tales como losartn o valsartn Ciertos medicamentos para la diabetes Diurticos [place] Compuestos de oro Litio AINE, medicamentos para Chief Technology Officer y la inflamacin, tales como ibuprofeno o naproxeno Suplementos o sales de potasio Sustitutos de la sal Sirolims Temsirolims Puede ser que esta lista no menciona todas las posibles interacciones. Informe a su profesional de [employer] de [employer] productos a base de hierbas, medicamentos de [place] o suplementos nutritivos que est tomando. Si usted fuma, consume bebidas alcohlicas o si utiliza drogas ilegales, indqueselo tambin a su profesional de [employer]. Algunas sustancias pueden interactuar con su medicamento. A qu debo estar atento al usar [employer]? Visite a su equipo de atencin para que revise su evolucin peridicamente. Revise su presin arterial como se le indique. Sepa cul debe ser su presin arterial y cundo deber contactar a su equipo de atencin. No se trate usted mismo si tiene tos, resfriado o dolor mientras est usando este medicamento sin consultar con su equipo de atencin. Algunos medicamentos pueden aumentar su presin arterial. Hable con su equipo de atencin si podra estar embarazada. Este medicamento puede causar defectos congnitos graves si se usa  durante el embarazo. Este medicamento podra afectar su coordinacin, tiempo de reaccin o juicio. No conduzca ni opere maquinaria  pesada hasta que  sepa cmo le afecta este medicamento. Pngase de pie o levntese lentamente para reducir el riesgo de mareos o [place]. Beber alcohol con [employer] puede aumentar el riesgo de estos efectos secundarios. Evite consumir sustitutos de la sal, a menos que su equipo de IT sales professional. Qu efectos secundarios puedo tener al [employer] este medicamento? Efectos secundarios que debe informar a su equipo de atencin tan pronto como sea posible: Therapist, art o angioedema: erupcin cutnea, comezn/picazn, urticaria, hinchazn de la cara, los ojos, los labios, la [place], los brazos o las piernas, dificultad para tragar o respirar Niveles elevados de potasio: debilidad muscular, frecuencia cardiaca rpida o irregular Lesin en los riones: disminucin en la cantidad de orina, hinchazn de los tobillos, las manos o los pies Lesin en el hgado: Engineer, mining en la regin abdominal superior derecha, prdida de apetito, nuseas, heces de color claro, orina amarilla oscura o marrn, color amarillento de los ojos o la piel, debilidad inusual, fatiga Presin arterial baja: mareo, sensacin de desmayo o aturdimiento, visin borrosa Efectos secundarios que generalmente no requieren atencin mdica (debe informarlos a su equipo de atencin si persisten o si son molestos): Tos Mareos Dolor de cabeza Puede ser que esta lista no menciona todos los posibles efectos secundarios. Comunquese a su mdico por asesoramiento mdico [employer]. Usted puede informar los efectos secundarios a la FDA por telfono al 1-800-FDA-1088. Dnde debo guardar mi medicina? Mantenga fuera del alcance de nios y Neurosurgeon. Guarde a temperatura ambiente, entre 20 y 25 grados Celsius (68 y 62 grados Fahrenheit). Proteja de la humedad. Mantenga el recipiente bien cerrado. No congele. Evite exponer al calor extremo. Deseche todo el medicamento que no haya utilizado despus de la fecha de  vencimiento. Para desechar los medicamentos que ya no necesite o que estn vencidos: Lleve el medicamento a un programa de recuperacin de medicamentos. Consulte con su farmacia o con una entidad reguladora para encontrar un lugar donde llevarlo. Si no puede devolver el medicamento, consulte la etiqueta o el folleto de informacin para ver si debe desecharlo en la basura o arrojarlo por el sanitario. Si no est seguro, pregunte a su equipo de atencin. Si es seguro colocarlo en la basura, saque el medicamento del recipiente. Mezcle el medicamento con piedras sanitarias para gatos, tierra, posos (residuos) de caf u otro desperdicio. Coloque la mezcla en una bolsa o recipiente que quede [place]. Deseche en la basura. ATENCIN: Este folleto es un resumen. Puede ser que no cubra toda la posible informacin. Si usted tiene preguntas acerca de esta medicina, consulte con su mdico, su farmacutico o su profesional de Radiographer, therapeutic.  2024 Elsevier/Gold Standard (2022-06-16 00:00:00)Hipertensin en los adultos Hypertension, Adult El trmino hipertensin es otra forma de denominar a la presin arterial elevada. La presin arterial elevada fuerza al corazn a trabajar ms para bombear la sangre. Esto puede causar problemas con el paso del [place]. Una lectura de presin arterial est compuesta por 2 nmeros. Hay un nmero superior (sistlico) sobre un nmero inferior (diastlico). Lo ideal es tener la presin arterial por debajo de 120/80. Cules son las causas? Se desconoce la causa de esta afeccin. Algunas otras afecciones pueden provocar presin arterial elevada. Qu incrementa el riesgo? Algunos factores del estilo de vida pueden hacer que tenga ms probabilidades de desarrollar presin arterial elevada: Fumar. No hacer la cantidad suficiente de actividad fsica o ejercicio. Tener sobrepeso. Consumir mucha grasa, azcar, caloras o sal (sodio) en su dieta. Beber alcohol en exceso. Otros factores  de  riesgo son los siguientes: Tener alguna de estas afecciones: Enfermedad cardaca. Diabetes. Colesterol alto. Enfermedad renal. Apnea obstructiva del sueo. Tener antecedentes familiares de presin arterial elevada y colesterol elevado. Edad. El riesgo aumenta con la edad. Estrs. Cules son los signos o sntomas? Es posible que la presin arterial alta no cause sntomas. La presin arterial muy alta (crisis hipertensiva) puede provocar: Dolor de cabeza. Latidos cardacos acelerados o irregulares (palpitaciones). Falta de aire. Hemorragia nasal. Vomitar o sentir ganas de vomitar (nuseas). Cambios en la forma de ver. Dolor muy intenso en el pecho. Sensacin de mareo. Convulsiones. Cmo se trata? Esta afeccin se trata haciendo cambios saludables en el estilo de vida, por ejemplo: Consumir alimentos saludables. Hacer ms ejercicio. Beber menos alcohol. El mdico puede recetarle medicamentos si los cambios en el estilo de vida no son lo suficientemente eficaces y si: El nmero de arriba est por encima de 130. El nmero de abajo est por encima de 80. Su presin arterial personal ideal puede variar. Siga estas indicaciones en su casa: Comida y bebida  Si se lo dicen, siga el plan de alimentacin de DASH (Dietary Approaches to Stop Hypertension, Maneras de alimentarse para detener la hipertensin). Para seguir este plan: Llene la mitad del plato de cada comida con frutas y verduras. Llene un cuarto del plato de cada comida con cereales integrales. Los cereales integrales incluyen pasta integral, arroz integral y pan integral. Coma y beba productos lcteos con bajo contenido de grasa, como leche descremada o yogur bajo en grasas. Llene un cuarto del plato de cada comida con protenas bajas en grasa (magras). Las protenas bajas en grasa incluyen pescado, pollo sin piel, huevos, frijoles y tofu. Evite consumir carne grasa, carne curada y procesada, o pollo con piel. Evite consumir  alimentos prehechos o procesados. Limite la cantidad de sal en su dieta a menos de 1500 mg por da. No beba alcohol si: El mdico le indica que no lo haga. Est embarazada, puede estar embarazada o est tratando de quedar embarazada. Si bebe alcohol: Limite la cantidad que bebe a lo siguiente: De 0 a 1 medida por da para las mujeres. De 0 a 2 medidas por da para los hombres. Sepa cunta cantidad de alcohol hay en las bebidas que toma. En los [address], una medida equivale a una botella de cerveza de 12 oz (355 ml), un vaso de vino de 5 oz (148 ml) o un vaso de una bebida alcohlica de alta graduacin de 1 oz (44 ml). Estilo de vida  Trabaje con su mdico para mantenerse en un peso saludable o para perder peso. Pregntele a su mdico cul es el peso recomendable para usted. Realice al menos 30 minutos de ejercicio que haga que se acelere su corazn (ejercicio Magazine features editor) la [employer] de la [place]. Estos pueden incluir caminar, nadar o andar en bicicleta. Realice al menos 30 minutos de ejercicio que fortalezca sus msculos (ejercicios de resistencia) al menos 3 das a la [place]. Estos pueden incluir levantar pesas o hacer [place]. No fume ni consuma ningn producto que contenga nicotina o tabaco. Si necesita ayuda para dejar de consumir estos productos, consulte al mdico. Controle su presin arterial en su casa tal como le indic el mdico. Concurra a todas las visitas de seguimiento. Medicamentos Use los medicamentos de venta libre y los recetados solamente como se lo haya indicado el mdico. Siga cuidadosamente las indicaciones. No omita las dosis de medicamentos para la presin arterial. Los medicamentos pierden eficacia  si omite dosis. El hecho de omitir las dosis tambin [place] el riesgo de otros problemas. Pregntele a su mdico a qu efectos secundarios o reacciones a los medicamentos debe prestar atencin. Comunquese con un mdico si: Piensa que tiene [place] reaccin a  los medicamentos que est tomando. Tiene dolores de cabeza frecuentes. Siente mareos. Tiene hinchazn en los tobillos. Tiene problemas de visin. Solicite ayuda de inmediato si: Siente un dolor de cabeza muy intenso. Empieza a sentirse desorientado (confundido). Se siente dbil o adormecido. Siente que va a desmayarse. Tiene un dolor muy intenso en: Pecho. Vientre (abdomen). Vomita ms de una vez. Tiene dificultad para respirar. Estos sntomas pueden Customer service manager. Solicite ayuda de inmediato. Llame al 911. No espere a ver si los sntomas desaparecen. No conduzca por sus propios medios [employer]. Resumen El trmino hipertensin es otra forma de denominar a la presin arterial elevada. La presin arterial elevada fuerza al corazn a trabajar ms para bombear la sangre. Para la [employer], una presin arterial normal es menor que 120/80. Las decisiones saludables pueden ayudarle a disminuir su presin arterial. Si no puede bajar su presin arterial mediante decisiones saludables, es posible que deba tomar medicamentos. Esta informacin no tiene Theme park manager el consejo del mdico. Asegrese de hacerle al mdico cualquier pregunta que tenga. Document Revised: 06/04/2021 Document Reviewed: 06/04/2021 Elsevier Patient Education  2024 Elsevier Inc.Obesidad en los adultos Obesity, Adult La obesidad es un exceso de Art gallery manager. Ser obeso significa que su peso es ms alto de lo que es saludable para usted.  El IMC (ndice de masa muscular) es un nmero que indica la cantidad de grasa corporal que tiene una persona. Si usted tiene un ndice de masa corporal (IMC) de 30 o ms, esto significa que es obeso. La obesidad puede causar problemas de salud graves, como los siguientes: Accidente cerebrovascular. Arteriopata coronaria (EAC). Diabetes tipo 2. Algunos tipos de cncer. Presin arterial alta (hipertensin arterial). Colesterol alto. Clculos en la  vescula biliar. La obesidad tambin puede contribuir a lo siguiente: Artrosis. Apnea del sueo. Problemas de esterilidad. Cules son las causas? Consumir todos los das alimentos con altos niveles de caloras, azcar y grasa. Beber gran cantidad de bebidas con azcar. Nacer con genes que pueden hacerlo ms propenso a ser obeso. Tener una afeccin mdica que causa obesidad. Tomar ciertos medicamentos. Permanecer mucho tiempo sentado (tener un estilo de vida sedentario). No dormir lo suficiente. Qu incrementa el riesgo? Tener antecedentes familiares de obesidad. Vivir en un rea con acceso limitado a las siguientes posibilidades: Parques, centros recreativos o veredas. Alimentos saludables, como se venden en tiendas de comestibles y mercados de Event organiser. Cules son los signos o sntomas? El principal signo es tener demasiada grasa corporal. Cmo se trata? El tratamiento de esta afeccin frecuentemente incluye cambiar el estilo de vida. El tratamiento puede incluir: Cambios en la dieta. Esto puede incluir crear un plan de alimentacin saludable. Realizar actividad fsica. Puede incluir una actividad que hace que el corazn lata ms rpido (ejercicio [place]) y Fish farm manager de Pensions consultant. Trabaje con su mdico para disear un programa que funcione para usted. Medicamentos para ayudarlo a Curator. Pueden utilizarse si no puede perder una libra por semana despus de 6 semanas de alimentacin saludable y ms ejercicio. Tratar las afecciones que causan la obesidad. [place]. Las opciones pueden incluir bandas gstricas y bypass gstrico. Esto puede realizarse en las siguientes situaciones: Otros tratamientos no mejoraron su afeccin. Tiene un IMC de 40 o superior. Tiene  problemas de salud potencialmente mortales relacionados con la obesidad. Siga estas indicaciones en su casa: Comida y bebida  Siga las instrucciones del mdico respecto de las comidas y las bebidas. Su mdico puede  recomendarle lo siguiente: Limitar las comidas rpidas, los dulces y las colaciones procesadas. Elegir opciones con bajo contenido de [place]. Por ejemplo, Counsellor de [employer]. Consumir cinco o ms porciones de frutas o verduras por da. Comer en casa con ms frecuencia. Esto le da ms control sobre lo que come. Elegir alimentos saludables cuando coma afuera. Aprender a leer las etiquetas de los alimentos. Esto le ayudar a aprender qu cantidad de alimento hay en una porcin. Tener a mano colaciones con bajo contenido de [place]. Evitar las bebidas que contengan mucha azcar. Estas incluyen refrescos, jugo de frutas, t helado con azcar y leche saborizada. Beba suficiente agua para mantener el pis (la [place]) de color amarillo plido. No siga las dietas de [place]. Actividad fsica Haga ejercicios con frecuencia, como se lo haya indicado el mdico. La mayora de los adultos deben hacer hasta 150 minutos de ejercicio de intensidad moderada cada semana.Pregntele al mdico lo siguiente: Los tipos de ejercicios que son seguros para usted. La frecuencia con la que [employer] ejercicios. Precaliente y elongue adecuadamente antes de hacer actividad fsica. Haga un estiramiento lento despus de la actividad (relajacin). Descanse entre los perodos de [place]. Estilo de vida Trabaje con su mdico y con un experto en alimentacin (nutricionista) para establecer un objetivo de prdida de peso que sea adecuado para usted. Limite el tiempo que pasa frente a una pantalla. Busque formas de recompensarse que no incluyan alimentos. No beba alcohol si: El mdico le indica que no lo haga. Est embarazada, puede estar embarazada o est tratando de quedar embarazada. Si bebe alcohol: Limite la cantidad que bebe a lo siguiente: De 0 a 1 medida por da para las mujeres. De 0 a 2 medidas por da para los hombres. Sepa cunta cantidad de alcohol hay en las bebidas que toma. En los  [address], una medida equivale a una botella de cerveza de 12 oz (355 ml), un vaso de vino de 5 oz (148 ml) o un vaso de una bebida alcohlica de alta graduacin de 1 oz (44 ml). Indicaciones generales Lleve un diario de su prdida de peso. Esto puede ayudarlo a Youth worker de lo siguiente: Los alimentos que come. Cunto ejercicio realiza. Use los medicamentos de venta libre y los recetados solamente como se lo haya indicado el mdico. Tome vitaminas y suplementos solamente como se lo haya indicado el mdico. Considere participar en un grupo de apoyo. Preste atencin a Radiographer, therapeutic mental, ya que la obesidad puede provocar depresin o problemas de [place]. Concurra a todas las visitas de seguimiento. Comunquese con un mdico si: No puede alcanzar su objetivo de prdida de peso despus de haber modificado su dieta y su estilo de vida durante 6 semanas. Presenta dificultades respiratorias sbitas. Resumen La obesidad es un exceso de Art gallery manager. Ser obeso significa que su peso es ms alto de lo que es saludable para usted. Trabaje con su mdico para establecer un objetivo de prdida de [place]. Haga actividad fsica con regularidad tal como le indic el mdico. Esta informacin no tiene Theme park manager el consejo del mdico. Asegrese de hacerle al mdico cualquier pregunta que tenga. Document Revised: 03/26/2021 Document Reviewed: 03/26/2021 Elsevier Patient Education  2024 [employer].

## 2023-11-28 NOTE — Progress Notes (Signed)
 New Patient Office Visit  Subjective    Patient ID: Spencer Whitaker male  DOB: [birth year]  Age: 48 y.o. MRN: [id number]   CC:  Establish care HPI     feet pain    Additional comments: B/L Numbness POCT A1C-5.9      Last edited by Kathryne Parisian, RMA on 11/28/2023 10:33 AM.      HPI  [name] is a 48 year old obese Hispanic male (Interpreter: Luis).  Blood pressure is elevated 150/100 and he is prediabetic.  Explained You are prediabetic  Prediabetes is 5.7-6.4 monitor carbohydrates -rice, potatoes, tortillas, [place] Corn Masa Flour, breads, pasta, sweets, sodas.  Increase exercising to help maintain appropriate weight.  No current outpatient medications on file prior to visit.   No current facility-administered medications on file prior to visit.    No Known Allergies  Past Medical History:  Diagnosis Date   Weight loss    Pt states he has taken weight loss treatment in the form of injections and pills to lose weight     Past Surgical History:  Procedure Laterality Date   LAPAROSCOPIC APPENDECTOMY N/A 04/21/2021   Procedure: APPENDECTOMY LAPAROSCOPIC;  Surgeon: Aldean Hummingbird, MD;  Location: [facility];  Service: General;  Laterality: N/A;   NO PAST SURGERIES       No family history on file.  Social History   Socioeconomic History   Marital status: Single    Spouse name: Not on file   Number of children: Not on file   Years of education: Not on file   Highest education level: Not on file  Occupational History   Not on file  Tobacco Use   Smoking status: Every Day    Current packs/day: 0.50    Types: Cigarettes   Smokeless tobacco: Never  Vaping Use   Vaping status: Never Used  Substance and Sexual Activity   Alcohol use: Yes    Comment: more than 15 beers some days and some days less   Drug use: Never   Sexual activity: Not on file  Other Topics Concern   Not on file  Social History Narrative   Not on file   Social  Drivers of Health   Financial Resource Strain: Not on file  Food Insecurity: Not on file  Transportation Needs: Not on file  Physical Activity: Not on file  Stress: Not on file  Social Connections: Not on file  Intimate Partner Violence: Not on file       Health Maintenance  Topic Date Due   Hepatitis C Screening  Never done   Colon Cancer Screening  Never done   COVID-19 Vaccine (1 - 2024-25 season) Never done   DTaP/Tdap/Td vaccine (1 - Tdap) 11/27/2024*   Pneumococcal Vaccination (1 of 2 - PCV) 11/27/2024*   Flu Shot  03/09/2024   HIV Screening  Completed   HPV Vaccine  Aged Out   Meningitis B Vaccine  Aged Out  *Topic was postponed. The date shown is not the original due date.    Objective   BP (!) 146/94 (Cuff Size: Normal)   Pulse 77   Resp 16   Ht 5\' 6"  (1.676 m)   Wt 221 lb 12.8 oz (100.6 kg)   SpO2 96%   BMI 35.80 kg/m   BP Readings from Last 3 Encounters:  11/28/23 (!) 146/94  03/17/23 (!) 153/91  02/20/23 (!) 151/98   Physical Exam Vitals reviewed.  Constitutional:  Appearance: He is obese.  HENT:     Head: Normocephalic.     Right Ear: Tympanic membrane and external ear normal.     Left Ear: Tympanic membrane and external ear normal.     Nose: Nose normal.  Eyes:     Extraocular Movements: Extraocular movements intact.     Pupils: Pupils are equal, round, and reactive to light.  Cardiovascular:     Rate and Rhythm: Normal rate and regular rhythm.  Pulmonary:     Effort: Pulmonary effort is normal.     Breath sounds: Normal breath sounds.  Abdominal:     General: Bowel sounds are normal. There is distension.     Palpations: Abdomen is soft.  Musculoskeletal:        General: Normal range of motion.  Skin:    General: Skin is warm and dry.  Neurological:     Mental Status: He is oriented to person, place, and time.  Psychiatric:        Mood and Affect: Mood normal.        Behavior: Behavior normal.        Thought Content: Thought  content normal.        Judgment: Judgment normal.        Assessment & Plan:  Liborio was seen today for new patient (initial visit) and feet pain.  Diagnoses and all orders for this visit:  Encounter to establish care  Prediabetes You are prediabetic .  Prediabetes is 5.7-6.4 monitor Increase exercising to help maintain appropriate weight.  -     CBC with Differential/Platelet -     POCT glycosylated hemoglobin (Hb A1C)  Essential hypertension BP goal - < 130/80 Explained that having normal blood pressure is the goal and medications are helping to get to goal and maintain normal blood pressure. DIET: Limit salt intake, read nutrition labels to check salt content, limit fried and high fatty foods  Avoid using multisymptom OTC cold preparations that generally contain sudafed which can rise BP. Consult with pharmacist on best cold relief products to use for persons with HTN EXERCISE Discussed incorporating exercise such as walking - 30 minutes most days of the week and can do in 10 minute intervals    -     CMP14+EGFR  Class 2 obesity due to excess calories without serious comorbidity with body mass index (BMI) of 35.0 to 35.9 in adult Obesity is 30-39 indicating an excess in caloric intake or underlining conditions. This may lead to other co-morbidities. Educated on lifestyle modifications of diet and exercise which may reduce obesity.    Lipid screening -     Lipid panel  Encounter for HCV screening test for low risk patient -     HCV Ab w Reflex to Quant PCR  Colon cancer screening -     Fecal occult blood, imunochemical; Future  Tetanus, diphtheria, and acellular pertussis (Tdap) vaccination declined  Pneumococcal vaccination declined  Neuropathy  Numbness and tingling bilateral feet and sometimes feels like his feet are swollen. Send in gabapentin  100 mg 3 times daily follow-up with blood pressure check for improvement  Other orders -     lisinopril  (ZESTRIL ) 20  MG tablet; Take 1 tablet (20 mg total) by mouth daily.  Follow-up:  Return for BP ck 4 week .  The above assessment and management plan was discussed with the patient. The patient verbalized understanding of and has agreed to the management plan. Patient is aware to call the clinic if  symptoms fail to improve or worsen. Patient is aware when to return to the clinic for a follow-up visit. Patient educated on when it is appropriate to go to the emergency department.   Madelyn Schick, NP-C

## 2023-11-29 LAB — CBC WITH DIFFERENTIAL/PLATELET

## 2023-11-29 LAB — CMP14+EGFR
ALT: 29 IU/L (ref 0–44)
AST: 25 IU/L (ref 0–40)
Albumin: 4.7 g/dL (ref 4.1–5.1)
Alkaline Phosphatase: 128 IU/L — ABNORMAL HIGH (ref 44–121)
BUN/Creatinine Ratio: 15 (ref 9–20)
BUN: 13 mg/dL (ref 6–24)
Bilirubin Total: 0.5 mg/dL (ref 0.0–1.2)
CO2: 26 mmol/L (ref 20–29)
Calcium: 9.3 mg/dL (ref 8.7–10.2)
Chloride: 104 mmol/L (ref 96–106)
Creatinine, Ser: 0.85 mg/dL (ref 0.76–1.27)
Globulin, Total: 2.9 g/dL (ref 1.5–4.5)
Glucose: 88 mg/dL (ref 70–99)
Potassium: 4.4 mmol/L (ref 3.5–5.2)
Sodium: 144 mmol/L (ref 134–144)
Total Protein: 7.6 g/dL (ref 6.0–8.5)
eGFR: 107 mL/min/{1.73_m2} (ref >59)

## 2023-11-29 LAB — LIPID PANEL
Chol/HDL Ratio: 5.9 ratio — ABNORMAL HIGH (ref 0.0–5.0)
Cholesterol, Total: 217 mg/dL — ABNORMAL HIGH (ref 100–199)
HDL: 37 mg/dL — ABNORMAL LOW (ref >39)
LDL Chol Calc (NIH): 115 mg/dL — ABNORMAL HIGH (ref 0–99)
Triglycerides: 371 mg/dL — ABNORMAL HIGH (ref 0–149)
VLDL Cholesterol Cal: 65 mg/dL — ABNORMAL HIGH (ref 5–40)

## 2023-11-29 LAB — HCV AB W REFLEX TO QUANT PCR: HCV Ab: NONREACTIVE

## 2023-11-29 LAB — HCV INTERPRETATION

## 2023-12-03 ENCOUNTER — Other Ambulatory Visit (INDEPENDENT_AMBULATORY_CARE_PROVIDER_SITE_OTHER): Payer: Self-pay | Admitting: Primary Care

## 2023-12-03 MED ORDER — PRAVASTATIN SODIUM 80 MG PO TABS
80.0000 mg | ORAL_TABLET | Freq: Every day | ORAL | 1 refills | Status: DC
  Filled 2023-12-03: qty 90, 90d supply, fill #0

## 2023-12-05 ENCOUNTER — Other Ambulatory Visit (HOSPITAL_COMMUNITY): Payer: Self-pay

## 2023-12-07 ENCOUNTER — Other Ambulatory Visit (HOSPITAL_COMMUNITY): Payer: Self-pay

## 2023-12-12 ENCOUNTER — Encounter (INDEPENDENT_AMBULATORY_CARE_PROVIDER_SITE_OTHER): Payer: Self-pay

## 2023-12-16 ENCOUNTER — Other Ambulatory Visit (HOSPITAL_COMMUNITY): Payer: Self-pay

## 2023-12-26 ENCOUNTER — Encounter (INDEPENDENT_AMBULATORY_CARE_PROVIDER_SITE_OTHER): Payer: Self-pay | Admitting: Primary Care

## 2023-12-26 ENCOUNTER — Ambulatory Visit (INDEPENDENT_AMBULATORY_CARE_PROVIDER_SITE_OTHER): Payer: Self-pay | Admitting: Primary Care

## 2023-12-26 VITALS — BP 130/88 | HR 72 | Resp 16 | Wt 218.0 lb

## 2023-12-26 DIAGNOSIS — I10 Essential (primary) hypertension: Secondary | ICD-10-CM

## 2023-12-26 DIAGNOSIS — G629 Polyneuropathy, unspecified: Secondary | ICD-10-CM

## 2023-12-26 DIAGNOSIS — G5603 Carpal tunnel syndrome, bilateral upper limbs: Secondary | ICD-10-CM

## 2023-12-26 MED ORDER — PRAVASTATIN SODIUM 80 MG PO TABS: 80.0000 mg | ORAL_TABLET | Freq: Every day | ORAL | 1 refills | Status: DC

## 2023-12-26 MED ORDER — GABAPENTIN 100 MG PO CAPS: 100.0000 mg | ORAL_CAPSULE | Freq: Three times a day (TID) | ORAL | 1 refills | Status: DC

## 2023-12-26 MED ORDER — LISINOPRIL 20 MG PO TABS: 20.0000 mg | ORAL_TABLET | Freq: Every day | ORAL | 1 refills | Status: DC

## 2023-12-26 NOTE — Progress Notes (Signed)
 [facility]   [name] is a 48 y.o. obese male ( interpreter Spencer Whitaker [phone]) presents for hypertension evaluation, Denies shortness of breath, chest pain or lower extremity edema, sudden onset, vision changes, unilateral weakness, dizziness, paresthesias  Patient reports adherence with medications. Dietary habits include: monitoring sodium intake  Exercise habits include:yes Family / Social history: No Patient voices concerns of bilateral hands and feet numbness with pain.  Patient is positive for Tinel and Phalen test  mild pain stated [place]  Past Medical History:  Diagnosis Date   Weight loss    Pt states he has taken weight loss treatment in the form of injections and pills to lose weight   Past Surgical History:  Procedure Laterality Date   LAPAROSCOPIC APPENDECTOMY N/A 04/21/2021   Procedure: APPENDECTOMY LAPAROSCOPIC;  Surgeon: Spencer Hummingbird, MD;  Location: [facility];  Service: General;  Laterality: N/A;   NO PAST SURGERIES     No Known Allergies Current Outpatient Medications on File Prior to Visit  Medication Sig Dispense Refill   gabapentin  (NEURONTIN ) 100 MG capsule Take 1 capsule (100 mg total) by mouth 3 (three) times daily. 90 capsule 0   lisinopril  (ZESTRIL ) 20 MG tablet Take 1 tablet (20 mg total) by mouth daily. 90 tablet 1   pravastatin  (PRAVACHOL ) 80 MG tablet Take 1 tablet (80 mg total) by mouth daily. 90 tablet 1   No current facility-administered medications on file prior to visit.   Social History   Socioeconomic History   Marital status: Single    Spouse name: Not on file   Number of children: Not on file   Years of education: Not on file   Highest education level: Not on file  Occupational History   Not on file  Tobacco Use   Smoking status: Every Day    Current packs/day: 0.50    Types: Cigarettes   Smokeless tobacco: Never  Vaping Use   Vaping status: Never Used  Substance and Sexual Activity   Alcohol use:  Yes    Comment: more than 15 beers some days and some days less   Drug use: Never   Sexual activity: Not on file  Other Topics Concern   Not on file  Social History Narrative   Not on file   Social Drivers of Health   Financial Resource Strain: Not on file  Food Insecurity: Not on file  Transportation Needs: Not on file  Physical Activity: Not on file  Stress: Not on file  Social Connections: Not on file  Intimate Partner Violence: Not on file   No family history on file. Health Maintenance  Topic Date Due   Colonoscopy  Never done   COVID-19 Vaccine (1 - 2024-25 season) Never done   DTaP/Tdap/Td (1 - Tdap) 11/27/2024 (Originally 07/16/1993)   Pneumococcal Vaccine 69-41 Years old (1 of 2 - PCV) 11/27/2024 (Originally 07/16/1993)   INFLUENZA VACCINE  03/09/2024   Hepatitis C Screening  Completed   HIV Screening  Completed   HPV VACCINES  Aged Out   Meningococcal B Vaccine  Aged Out     OBJECTIVE:  Vitals:   12/26/23 0859 12/26/23 0907  BP: (!) 138/92 130/88  Pulse: 72   Resp: 16   SpO2: 96%   Weight: 218 lb (98.9 kg)     Physical Exam Vitals reviewed.  Constitutional:      Appearance: Normal appearance. He is obese.  HENT:     Head: Normocephalic.     Nose: Nose  normal.  Eyes:     Extraocular Movements: Extraocular movements intact.  Cardiovascular:     Rate and Rhythm: Normal rate and regular rhythm.  Pulmonary:     Effort: Pulmonary effort is normal.     Breath sounds: Normal breath sounds.  Abdominal:     General: Bowel sounds are normal. There is distension.     Palpations: Abdomen is soft.  Musculoskeletal:        General: Normal range of motion.     Cervical back: Normal range of motion and neck supple.  Neurological:     Mental Status: He is oriented to person, place, and time.  Psychiatric:        Mood and Affect: Mood normal.        Behavior: Behavior normal.      ROS Comprehensive ROS Pertinent positive and negative noted in HPI    Last 3 Office BP readings: BP Readings from Last 3 Encounters:  12/26/23 130/88  11/28/23 (!) 146/94  03/17/23 (!) 153/91    BMET    Component Value Date/Time   NA 144 11/28/2023 1137   K 4.4 11/28/2023 1137   CL 104 11/28/2023 1137   CO2 26 11/28/2023 1137   GLUCOSE 88 11/28/2023 1137   GLUCOSE 110 (H) 03/17/2023 1258   BUN 13 11/28/2023 1137   CREATININE 0.85 11/28/2023 1137   CALCIUM 9.3 11/28/2023 1137   GFRNONAA >60 03/17/2023 1258   GFRAA >60 05/09/2020 1212    Renal function: CrCl cannot be calculated (Patient's most recent lab result is older than the maximum 21 days allowed.).  Clinical ASCVD: Yes  The 10-year ASCVD risk score (Arnett DK, et al., 2019) is: 13.2%   Values used to calculate the score:     Age: 15 years     Sex: Male     Is Non-Hispanic African American: No     Diabetic: No     Tobacco smoker: Yes     Systolic Blood Pressure: 130 mmHg     Is BP treated: Yes     HDL Cholesterol: 37 mg/dL     Total Cholesterol: 217 mg/dL  ASCVD risk factors include- [place]   ASSESSMENT & PLAN: Spencer Whitaker was seen today for blood pressure check and numbness.  Diagnoses and all orders for this visit:  Bilateral carpal tunnel syndrome  positive for Tinel and Phalen test    Essential hypertension  -Counseled on lifestyle modifications for blood pressure control including reduced dietary sodium, increased exercise, weight reduction and adequate sleep. Also, educated patient about the risk for cardiovascular events, stroke and heart attack. Also counseled patient about the importance of medication adherence. If you participate in smoking, it is important to stop using tobacco as this will increase the risks associated with uncontrolled blood pressure.   Goal BP:  For patients younger than 60: Goal BP < 130/80. For patients 60 and older: Goal BP < 140/90. For patients with diabetes: Goal BP < 130/80. Your most recent BP: 130/88  Minimize salt intake. Minimize  alcohol intake  Neuropathy  Numbness and tingling bilateral feet Sent in gabapentin  100 mg 3 times daily never picked up  Referred to hand specialist-awaiting patient to fill out: Assistance Medication sent to wrong pharmacy changed to My Pharmacy  This note has been created with Education officer, environmental. Any transcriptional errors are unintentional.   Marius Siemens, NP 12/26/2023, 9:29 AM

## 2024-03-26 ENCOUNTER — Encounter (INDEPENDENT_AMBULATORY_CARE_PROVIDER_SITE_OTHER): Payer: Self-pay | Admitting: Primary Care

## 2024-03-26 ENCOUNTER — Telehealth (INDEPENDENT_AMBULATORY_CARE_PROVIDER_SITE_OTHER): Payer: Self-pay | Admitting: Primary Care

## 2024-03-26 ENCOUNTER — Ambulatory Visit (INDEPENDENT_AMBULATORY_CARE_PROVIDER_SITE_OTHER): Payer: Self-pay | Admitting: Primary Care

## 2024-03-26 VITALS — BP 144/92 | HR 74 | Wt 207.0 lb

## 2024-03-26 DIAGNOSIS — G629 Polyneuropathy, unspecified: Secondary | ICD-10-CM

## 2024-03-26 DIAGNOSIS — E66812 Obesity, class 2: Secondary | ICD-10-CM

## 2024-03-26 DIAGNOSIS — Z1211 Encounter for screening for malignant neoplasm of colon: Secondary | ICD-10-CM

## 2024-03-26 DIAGNOSIS — I10 Essential (primary) hypertension: Secondary | ICD-10-CM

## 2024-03-26 DIAGNOSIS — Z6835 Body mass index (BMI) 35.0-35.9, adult: Secondary | ICD-10-CM

## 2024-03-26 MED ORDER — LISINOPRIL 20 MG PO TABS: 20.0000 mg | ORAL_TABLET | Freq: Every day | ORAL | 1 refills | Status: DC

## 2024-03-26 MED ORDER — GABAPENTIN 100 MG PO CAPS: 100.0000 mg | ORAL_CAPSULE | Freq: Four times a day (QID) | ORAL | 3 refills | Status: DC

## 2024-03-26 MED ORDER — GABAPENTIN 100 MG PO CAPS: 100.0000 mg | ORAL_CAPSULE | Freq: Four times a day (QID) | ORAL | 1 refills | Status: AC

## 2024-03-26 MED ORDER — GABAPENTIN 100 MG PO CAPS: 100.0000 mg | ORAL_CAPSULE | Freq: Three times a day (TID) | ORAL | 1 refills | Status: DC

## 2024-03-26 NOTE — Progress Notes (Signed)
 Subjective:   Spencer Whitaker is a 48 y.o.  Hispanic male (Spencer Whitaker interpreter)  Past Medical History:  Diagnosis Date   Weight loss    Pt states he has taken weight loss treatment in the form of injections and pills to lose weight     No Known Allergies  Current Outpatient Medications on File Prior to Visit  Medication Sig Dispense Refill   pravastatin  (PRAVACHOL ) 80 MG tablet Take 1 tablet (80 mg total) by mouth daily. 90 tablet 1   No current facility-administered medications on file prior to visit.    Review of System: ROS Comprehensive ROS Pertinent positive and negative noted in HPI   Objective:  BP (!) 144/92 (Cuff Size: Large)   Pulse 74   Wt 207 lb (93.9 kg)   SpO2 95%   BMI 33.41 kg/m   Filed Weights   03/26/24 1007  Weight: 207 lb (93.9 kg)    Physical Exam Vitals reviewed.  Constitutional:      Appearance: He is obese.  HENT:     Head: Normocephalic.     Right Ear: Tympanic membrane and external ear normal.     Left Ear: Tympanic membrane and external ear normal.     Nose: Nose normal.  Eyes:     Extraocular Movements: Extraocular movements intact.     Pupils: Pupils are equal, round, and reactive to light.  Cardiovascular:     Rate and Rhythm: Normal rate and regular rhythm.  Pulmonary:     Effort: Pulmonary effort is normal.     Breath sounds: Normal breath sounds.  Abdominal:     General: Bowel sounds are normal. There is distension.     Palpations: Abdomen is soft.  Musculoskeletal:        General: Normal range of motion.  Skin:    General: Skin is warm and dry.     Findings: Rash present.     Comments: face  Neurological:     Mental Status: He is oriented to person, place, and time.  Psychiatric:        Mood and Affect: Mood normal.        Behavior: Behavior normal.        Thought Content: Thought content normal.        Judgment: Judgment normal.      Assessment:  Spencer Whitaker was seen today for medication refill,  hypertension and numbness.  Diagnoses and all orders for this visit:  Essential hypertension BP goal - < 140/90 Explained that having normal blood pressure is the goal and medications are helping to get to goal and maintain normal blood pressure. DIET: Limit salt intake, read nutrition labels to check salt content, limit fried and high fatty foods  Avoid using multisymptom OTC cold preparations that generally contain sudafed which can rise BP. Consult with pharmacist on best cold relief products to use for persons with HTN EXERCISE Discussed incorporating exercise such as walking - 30 minutes most days of the week and can do in 10 minute intervals     Neuropathy -     gabapentin  (NEURONTIN ) 100 MG capsule; Take 1 capsule (100 mg total) by mouth  times daily.  Class 2 obesity due to excess calories without serious comorbidity with body mass index (BMI) of 35.0 to 35.9 in adult Obesity is 30-39 indicating an excess in caloric intake or underlining conditions. This may lead to other co-morbidities. Educated on lifestyle modifications of diet and exercise which may reduce obesity.  Other orders -     lisinopril  (ZESTRIL ) 20 MG tablet; Take 1 tablet (20 mg total) by mouth daily.     This note has been created with Education officer, environmental. Any transcriptional errors are unintentional.   Return in about 4 weeks (around 04/23/2024) for re-check blood pressure.  Spencer SHAUNNA Bohr, NP 03/26/2024, 8:25 PM

## 2024-03-26 NOTE — Telephone Encounter (Signed)
 Copied from CRM #[id number]. Topic: Clinical - Medication Question >> Mar 26, 2024 11:21 AM Sophia H wrote: Reason for CRM: Spoke with Atalee who is needing clarification on an rx for gabapentin  (NEURONTIN ) 100 MG capsule - states received 2 rx's, one for 3x per day and another for 4x. Just needing to know which to fill.    [phone] can speak with anyone

## 2024-03-26 NOTE — Patient Instructions (Signed)
Hipertensin en los adultos Hypertension, Adult El trmino hipertensin es otra forma de denominar a la presin arterial elevada. La presin arterial elevada fuerza al corazn a trabajar ms para bombear la sangre. Esto puede causar problemas con el paso del tiempo. Una lectura de presin arterial est compuesta por 2 nmeros. Hay un nmero superior (sistlico) sobre un nmero inferior (diastlico). Lo ideal es tener la presin arterial por debajo de 120/80. Cules son las causas? Se desconoce la causa de esta afeccin. Algunas otras afecciones pueden provocar presin arterial elevada. Qu incrementa el riesgo? Algunos factores del estilo de vida pueden hacer que tenga ms probabilidades de desarrollar presin arterial elevada: Fumar. No hacer la cantidad suficiente de actividad fsica o ejercicio. Tener sobrepeso. Consumir mucha grasa, azcar, caloras o sal (sodio) en su dieta. Beber alcohol en exceso. Otros factores de riesgo son los siguientes: Tener alguna de estas afecciones: Enfermedad cardaca. Diabetes. Colesterol alto. Enfermedad renal. Apnea obstructiva del sueo. Tener antecedentes familiares de presin arterial elevada y colesterol elevado. Edad. El riesgo aumenta con la edad. Estrs. Cules son los signos o sntomas? Es posible que la presin arterial alta no cause sntomas. La presin arterial muy alta (crisis hipertensiva) puede provocar: Dolor de cabeza. Latidos cardacos acelerados o irregulares (palpitaciones). Falta de aire. Hemorragia nasal. Vomitar o sentir ganas de vomitar (nuseas). Cambios en la forma de ver. Dolor muy intenso en el pecho. Sensacin de mareo. Convulsiones. Cmo se trata? Esta afeccin se trata haciendo cambios saludables en el estilo de vida, por ejemplo: Consumir alimentos saludables. Hacer ms ejercicio. Beber menos alcohol. El mdico puede recetarle medicamentos si los cambios en el estilo de vida no son lo suficientemente  eficaces y si: El nmero de arriba est por encima de 130. El nmero de abajo est por encima de 80. Su presin arterial personal ideal puede variar. Siga estas indicaciones en su casa: Comida y bebida  Si se lo dicen, siga el plan de alimentacin de DASH (Dietary Approaches to Stop Hypertension, Maneras de alimentarse para detener la hipertensin). Para seguir este plan: Llene la mitad del plato de cada comida con frutas y verduras. Llene un cuarto del plato de cada comida con cereales integrales. Los cereales integrales incluyen pasta integral, arroz integral y pan integral. Coma y beba productos lcteos con bajo contenido de grasa, como leche descremada o yogur bajo en grasas. Llene un cuarto del plato de cada comida con protenas bajas en grasa (magras). Las protenas bajas en grasa incluyen pescado, pollo sin piel, huevos, frijoles y tofu. Evite consumir carne grasa, carne curada y procesada, o pollo con piel. Evite consumir alimentos prehechos o procesados. Limite la cantidad de sal en su dieta a menos de 1500 mg por da. No beba alcohol si: El mdico le indica que no lo haga. Est embarazada, puede estar embarazada o est tratando de quedar embarazada. Si bebe alcohol: Limite la cantidad que bebe a lo siguiente: De 0 a 1 medida por da para las mujeres. De 0 a 2 medidas por da para los hombres. Sepa cunta cantidad de alcohol hay en las bebidas que toma. En los [place], una medida equivale a una botella de cerveza de 12 oz (355 ml), un vaso de vino de 5 oz (148 ml) o un vaso de una bebida alcohlica de alta graduacin de 1 oz (44 ml). Estilo de vida  Trabaje con su mdico para mantenerse en un peso saludable o para perder peso. Pregntele a su mdico cul es el peso recomendable para   usted. Realice al menos 30 minutos de ejercicio que haga que se acelere su corazn (ejercicio aerbico) la mayora de los das de la semana. Estos pueden incluir caminar, nadar o andar en  bicicleta. Realice al menos 30 minutos de ejercicio que fortalezca sus msculos (ejercicios de resistencia) al menos 3 das a la semana. Estos pueden incluir levantar pesas o hacer Pilates. No fume ni consuma ningn producto que contenga nicotina o tabaco. Si necesita ayuda para dejar de consumir estos productos, consulte al mdico. Controle su presin arterial en su casa tal como le indic el mdico. Concurra a todas las visitas de seguimiento. Medicamentos Use los medicamentos de venta libre y los recetados solamente como se lo haya indicado el mdico. Siga cuidadosamente las indicaciones. No omita las dosis de medicamentos para la presin arterial. Los medicamentos pierden eficacia si omite dosis. El hecho de omitir las dosis tambin aumenta el riesgo de otros problemas. Pregntele a su mdico a qu efectos secundarios o reacciones a los medicamentos debe prestar atencin. Comunquese con un mdico si: Piensa que tiene una reaccin a los medicamentos que est tomando. Tiene dolores de cabeza frecuentes. Siente mareos. Tiene hinchazn en los tobillos. Tiene problemas de visin. Solicite ayuda de inmediato si: Siente un dolor de cabeza muy intenso. Empieza a sentirse desorientado (confundido). Se siente dbil o adormecido. Siente que va a desmayarse. Tiene un dolor muy intenso en: Pecho. Vientre (abdomen). Vomita ms de una vez. Tiene dificultad para respirar. Estos sntomas pueden indicar una emergencia. Solicite ayuda de inmediato. Llame al 911. No espere a ver si los sntomas desaparecen. No conduzca por sus propios medios hasta el hospital. Resumen El trmino hipertensin es otra forma de denominar a la presin arterial elevada. La presin arterial elevada fuerza al corazn a trabajar ms para bombear la sangre. Para la mayora de las personas, una presin arterial normal es menor que 120/80. Las decisiones saludables pueden ayudarle a disminuir su presin arterial. Si no puede  bajar su presin arterial mediante decisiones saludables, es posible que deba tomar medicamentos. Esta informacin no tiene como fin reemplazar el consejo del mdico. Asegrese de hacerle al mdico cualquier pregunta que tenga. Document Revised: 06/04/2021 Document Reviewed: 06/04/2021 Elsevier Patient Education  2024 [employer]  

## 2024-03-27 ENCOUNTER — Inpatient Hospital Stay (INDEPENDENT_AMBULATORY_CARE_PROVIDER_SITE_OTHER): Payer: Self-pay | Admitting: Primary Care

## 2024-03-27 LAB — LIPID PANEL
Chol/HDL Ratio: 5 ratio (ref 0.0–5.0)
Cholesterol, Total: 200 mg/dL — ABNORMAL HIGH (ref 100–199)
HDL: 40 mg/dL (ref >39)
LDL Chol Calc (NIH): 115 mg/dL — ABNORMAL HIGH (ref 0–99)
Triglycerides: 257 mg/dL — ABNORMAL HIGH (ref 0–149)
VLDL Cholesterol Cal: 45 mg/dL — ABNORMAL HIGH (ref 5–40)

## 2024-03-27 NOTE — Telephone Encounter (Signed)
 FYI:   Called Pharmacy and clarified to QID  daily they stated that the patient had already picked medication up for the 3x daily. Pharmacy stated they will discontinue that script and when he refills medication they will give him the  4x daily .

## 2024-03-28 NOTE — Telephone Encounter (Signed)
 Spoke to [place] with [facility]acy. Confirmed QID for sig.

## 2024-03-30 ENCOUNTER — Ambulatory Visit (INDEPENDENT_AMBULATORY_CARE_PROVIDER_SITE_OTHER): Payer: Self-pay | Admitting: Primary Care

## 2024-03-30 ENCOUNTER — Other Ambulatory Visit (HOSPITAL_COMMUNITY): Payer: Self-pay

## 2024-03-30 DIAGNOSIS — I10 Essential (primary) hypertension: Secondary | ICD-10-CM

## 2024-03-30 DIAGNOSIS — E782 Mixed hyperlipidemia: Secondary | ICD-10-CM

## 2024-03-30 MED ORDER — PRAVASTATIN SODIUM 80 MG PO TABS
80.0000 mg | ORAL_TABLET | Freq: Every day | ORAL | 1 refills | Status: AC
  Filled 2024-03-30: qty 90, 90d supply, fill #0
  Filled 2024-04-16: qty 30, 30d supply, fill #0

## 2024-03-30 MED ORDER — LISINOPRIL 20 MG PO TABS
20.0000 mg | ORAL_TABLET | Freq: Every day | ORAL | 1 refills | Status: AC
  Filled 2024-03-30 – 2024-04-16 (×2): qty 90, 90d supply, fill #0
  Filled 2024-07-03: qty 90, 90d supply, fill #1

## 2024-04-10 ENCOUNTER — Other Ambulatory Visit (HOSPITAL_COMMUNITY): Payer: Self-pay

## 2024-04-10 ENCOUNTER — Ambulatory Visit (INDEPENDENT_AMBULATORY_CARE_PROVIDER_SITE_OTHER): Payer: Self-pay

## 2024-04-16 ENCOUNTER — Other Ambulatory Visit (HOSPITAL_COMMUNITY): Payer: Self-pay

## 2024-04-16 ENCOUNTER — Other Ambulatory Visit: Payer: Self-pay

## 2024-04-16 ENCOUNTER — Encounter (INDEPENDENT_AMBULATORY_CARE_PROVIDER_SITE_OTHER): Payer: Self-pay | Admitting: Primary Care

## 2024-04-16 ENCOUNTER — Ambulatory Visit (INDEPENDENT_AMBULATORY_CARE_PROVIDER_SITE_OTHER): Payer: Self-pay | Admitting: Primary Care

## 2024-04-16 VITALS — BP 140/90 | HR 69 | Temp 98.6°F | Resp 14

## 2024-04-16 DIAGNOSIS — I10 Essential (primary) hypertension: Secondary | ICD-10-CM

## 2024-04-16 DIAGNOSIS — R519 Headache, unspecified: Secondary | ICD-10-CM

## 2024-04-16 DIAGNOSIS — G8929 Other chronic pain: Secondary | ICD-10-CM

## 2024-04-16 DIAGNOSIS — Z1211 Encounter for screening for malignant neoplasm of colon: Secondary | ICD-10-CM

## 2024-04-16 MED ORDER — HYDROCHLOROTHIAZIDE 25 MG PO TABS
25.0000 mg | ORAL_TABLET | Freq: Every day | ORAL | 1 refills | Status: AC
  Filled 2024-04-16 (×2): qty 90, 90d supply, fill #0

## 2024-04-16 MED ORDER — IBUPROFEN 800 MG PO TABS: 800.0000 mg | ORAL_TABLET | Freq: Three times a day (TID) | ORAL | 1 refills | Status: AC | PRN

## 2024-04-16 NOTE — Patient Instructions (Signed)
 Prevencin de la hipertensin Preventing Hypertension La hipertensin, tambin conocida como presin arterial alta, se produce cuando la sangre bombea en las arterias con demasiada fuerza. Las arterias son vasos sanguneos que transportan la sangre desde el corazn al resto del cuerpo. Con frecuencia, la hipertensin no causa sntomas hasta que la presin arterial es muy alta. Es importante que controle regularmente su presin arterial. Los cambios en la dieta y el estilo de vida pueden ayudar a prevenir la hipertensin y a Estate agent mejor al mejorar su calidad de vida. Si ya tiene hipertensin, puede controlarla con cambios en la dieta y el estilo de vida y con medicamentos. Cmo puede afectarme esta enfermedad? Con el transcurso del [place], la hipertensin puede daar las arterias y Engineer, manufacturing systems flujo de sangre hacia partes importantes del cuerpo que incluyen el cerebro, el corazn y los riones. Si mantiene su presin arterial en un nivel saludable, podr prevenir complicaciones como un infarto de miocardio, insuficiencia cardaca, un accidente cerebrovascular, insuficiencia renal y demencia vascular. Qu puede aumentar el riesgo? Una alimentacin poco saludable y la falta de actividad fsica pueden aumentar las probabilidades de tener presin arterial alta. Algunos otros factores de riesgo son los siguientes: Edad. El riesgo aumenta con la edad. Tener familiares que han tenido presin arterial alta. Tener ciertas afecciones, como problemas de tiroides. Tener sobrepeso u obesidad. Consumir cafena o alcohol en exceso. Consumir mucha grasa, azcar, caloras o sal (sodio) en su dieta. Fumar o consumir drogas ilegales. Tomar ciertos medicamentos, como antidepresivos, descongestivos, pldoras anticonceptivas y antiinflamatorios no esteroideos (AINE), como el ibuprofeno. Qu medidas puedo tomar para prevenir o controlar esta afeccin? Trabaje junto al mdico para desarrollar un plan de  prevencin de la hipertensin que funcione para usted. Es posible que lo deriven para que reciba asesoramiento sobre una dieta saludable y Doroteo Glassman fsica. Siga su plan y Joelyn Oms a todas las visitas de seguimiento. Cambios en la dieta Siga una dieta saludable. Esto puede comprender lo siguiente: Menor ingesta de sal (sodio). Pregntele al mdico cunto sodio puede consumir de forma segura. La recomendacin general es consumir menos de 1 cucharadita (2300 mg) de sodio por da. No agregue sal a las comidas. Opte por alimentos con bajo contenido de sodio cuando realice las compras o coma fuera de casa. Limite la cantidad de grasa en la dieta. Esto se puede lograr con [employer] o de bajo contenido de grasas e ingiriendo menor cantidad de carnes rojas. Coma ms frutas, verduras y cereales integrales. Establezca un objetivo para comer: 1 a 2 tazas de frutas y verduras frescas todos los [address]  [address]. 3 a 4 porciones de cereales [employer]. Evite los alimentos y las bebidas que tengan azcares agregados. Coma pescados que contengan grasas saludables (cidos grasos omega-3), como la caballa o el salmn. Si necesita implementar un plan de comidas saludable, pruebe la dieta DASH. Esta dieta tiene un alto contenido de frutas, verduras y Radiation protection practitioner. Incluye poca cantidad de sodio, carnes rojas y azcares agregados. DASH es la sigla en ingls de "Enfoques Alimentarios para Detener la Hipertensin". Cambios en el estilo de vida  Baje de peso si es necesario. Con tan solo bajar entre el 3 % y el 5 % del peso corporal, puede prevenir o Chief Operating Officer la hipertensin. Por ejemplo, si su peso actual es de 200 libras (91 kg), una prdida entre el 3 % y el 5 % de su peso significa perder entre 6 y 10 libras (2.7 a 4.5 kg). Pdale al [employer]  recomiende una dieta y un plan de ejercicios para bajar de peso de forma segura. Ejerctate lo suficiente. Debe realizar al menos 150 minutos de ejercicios  de intensidad moderada todas las semanas. Puede realizar [employer] en sesiones cortas de ejercicios, varias veces al da, o puede realizar sesiones ms largas, pero menos veces por semana. Por ejemplo, puede realizar una caminata enrgica o andar en bicicleta durante 10 minutos, 3 veces al da, durante 5 das a la [place]. Encuentre maneras de reducir el estrs, como hacer ejercicios, Primary school teacher, Optometrist o tomar una clase de yoga. Si necesita ayuda para reducir [employer] de estrs, consulte al mdico. No consuma ningn producto que contenga nicotina o tabaco. Estos productos incluyen cigarrillos, tabaco para Theatre manager y aparatos de vapeo, como los Administrator, Civil Service. Las sustancias qumicas presentes en los productos con tabaco y nicotina elevan su presin arterial cada vez que los consume. Si necesita ayuda para dejar de consumir estos productos, consulte al mdico. Aprenda a medir su presin arterial en casa. Asegrese de Solicitor su objetivo de presin arterial, como se lo haya indicado el mdico. Trate de dormir entre 7 y 9 horas todas las noches. Consumo de alcohol No beba alcohol si: Su mdico le indica no hacerlo. Est embarazada, puede estar embarazada o est tratando de [place]. Si bebe alcohol: Limite la cantidad que bebe a lo siguiente: De 0 a 1 medida por da para las mujeres. De 0 a 2 medidas por da para los hombres. Sepa cunta cantidad de alcohol hay en las bebidas que toma. En los [address], una medida equivale a una botella de cerveza de 12 oz (355 ml), un vaso de vino de 5 oz (148 ml) o un vaso de una bebida alcohlica de alta graduacin de 1 oz (44 ml). Medicamentos Adems de los cambios en la dieta y el estilo de vida, [place] mdico podr indicarle medicamentos para ayudarle a Publishing copy su presin arterial. En general: Tal vez deba probar distintos medicamentos hasta encontrar el ms adecuado para usted. Quiz necesite tomar ms de un medicamento. Use los  medicamentos de venta libre y los recetados solamente como se lo haya indicado el mdico. Preguntas para hacerle al mdico Cul es mi presin arterial ideal? Cmo disminuyo mi riesgo de tener presin arterial alta? Cmo debo controlar mi presin arterial en casa? Dnde obtener apoyo Su mdico puede ayudarle a prevenir la hipertensin y Pharmacologist su presin arterial en un nivel saludable. Su hospital o comunidad locales tambin pueden proporcionarle servicios y programas de prevencin. La American Heart Association (Asociacin Estadounidense del [place]) ofrece un red de ayuda en lnea en supportnetwork.[email or web address] Dnde obtener ms informacin Obtenga ms informacin sobre la hipertensin en: Armed forces training and education officer, Lung, and Blood Institute (Instituto Nacional del [place], los Pulmones y Risk manager): [email or web address].is Centers for Disease Control and Prevention (Centros para el Control y la Prevencin de Event organiser): FootballExhibition.com.br American Academy of Family Physicians (Academia Estadounidense de Mdicos de [place]): Hydrologist.org Obtenga ms informacin sobre la dieta DASH en: [employer], Lung, and Blood Institute (Instituto [employer] del [place], los Pulmones y Risk manager): [email or web address].is Comunquese con un mdico si: Piensa que tiene una reaccin alrgica a los medicamentos que ha tomado. Tiene mareos o dolores de cabeza con Naval architect. Tiene hinchazn en los tobillos. Tiene problemas de visin. Solicite ayuda de inmediato si: Tiene un dolor o Dentist repentino o intenso en el pecho, la espalda o el abdomen. Le falta el aire. Tienes un dolor de cabeza repentino e intenso. Estos sntomas pueden  indicar Radio broadcast assistant. Solicite ayuda de inmediato. Llame al 911. No espere a ver si los sntomas desaparecen. No conduzca por sus propios medios [facility]. Resumen La hipertensin con frecuencia no provoca sntomas hasta que la presin arterial es muy alta. Es importante que controle  regularmente su presin arterial. Los cambios en la dieta y el estilo de vida son pasos importantes para prevenir la hipertensin. Si mantiene su presin arterial en un nivel saludable, podr prevenir complicaciones como un infarto de miocardio, insuficiencia cardaca, un accidente cerebrovascular e insuficiencia renal. Trabaje junto al mdico para desarrollar un plan de prevencin de la hipertensin que funcione para usted. Esta informacin no tiene Theme park manager el consejo del mdico. Asegrese de hacerle al mdico cualquier pregunta que tenga. Document Revised: 06/10/2021 Document Reviewed: 06/10/2021 Elsevier Patient Education  2024 [employer].

## 2024-04-16 NOTE — Progress Notes (Signed)
 [facility]   Spencer Whitaker is a 48 y.o. Hispanic male brought (interpreter Eulalio [phone]) presents for hypertension evaluation, Denies shortness of breath,chest pain or lower extremity edema, sudden onset, vision changes, unilateral weakness, dizziness, paresthesias . He endorses headaches intermittent mostly after getting off of work. 3/10- takes no medication. He is also concerned that his brother called last night to tell him his father wasn't . Father is in [place] and going to the doctor today.  Patient reports adherence with medications. Picked up medication 2 weeks ago.  Blood pressure remains elevated taking gabapentin  4 times a day which he admits to helping with neuropathy and taking blood pressure medicine in the morning. Report acute back pain started 2-3 weeks ago having spasm /pain- 3/10 . Sleeps 6-7 hrs-good restful  Dietary habits include: corn, rice , monitoring sodium  Exercise habits include: walking    Past Medical History:  Diagnosis Date   Weight loss    Pt states he has taken weight loss treatment in the form of injections and pills to lose weight   Past Surgical History:  Procedure Laterality Date   LAPAROSCOPIC APPENDECTOMY N/A 04/21/2021   Procedure: APPENDECTOMY LAPAROSCOPIC;  Surgeon: Tanda Locus, MD;  Location: [facility];  Service: General;  Laterality: N/A;   NO PAST SURGERIES     No Known Allergies Current Outpatient Medications on File Prior to Visit  Medication Sig Dispense Refill   gabapentin  (NEURONTIN ) 100 MG capsule Take 1 capsule (100 mg total) by mouth 4 (four) times daily. 120 capsule 1   lisinopril  (ZESTRIL ) 20 MG tablet Take 1 tablet (20 mg total) by mouth daily. 90 tablet 1   pravastatin  (PRAVACHOL ) 80 MG tablet Take 1 tablet (80 mg total) by mouth daily. 90 tablet 1   No current facility-administered medications on file prior to visit.   Social History   Socioeconomic History   Marital status: Single    Spouse  name: Not on file   Number of children: Not on file   Years of education: Not on file   Highest education level: Not on file  Occupational History   Not on file  Tobacco Use   Smoking status: Every Day    Current packs/day: 0.50    Types: Cigarettes   Smokeless tobacco: Never  Vaping Use   Vaping status: Never Used  Substance and Sexual Activity   Alcohol use: Yes    Comment: more than 15 beers some days and some days less   Drug use: Never   Sexual activity: Not on file  Other Topics Concern   Not on file  Social History Narrative   Not on file   Social Drivers of Health   Financial Resource Strain: Not on file  Food Insecurity: Not on file  Transportation Needs: Not on file  Physical Activity: Not on file  Stress: Not on file  Social Connections: Not on file  Intimate Partner Violence: Not on file   History reviewed. No pertinent family history. Health Maintenance  Topic Date Due   Hepatitis B Vaccines 19-59 Average Risk (1 of 3 - 19+ 3-dose series) Never done   Colonoscopy  Never done   COVID-19 Vaccine (1 - 2024-25 season) 05/02/2024 (Originally 04/09/2024)   Influenza Vaccine  11/06/2024 (Originally 03/09/2024)   DTaP/Tdap/Td (1 - Tdap) 11/27/2024 (Originally 07/16/1993)   Pneumococcal Vaccine (1 of 2 - PCV) 11/27/2024 (Originally 07/16/1993)   Hepatitis C Screening  Completed   HIV Screening  Completed   HPV  VACCINES  Aged Out   Meningococcal B Vaccine  Aged Out     OBJECTIVE:  Vitals:   04/16/24 0853 04/16/24 0944  BP: (!) 154/100 (!) 140/90  Pulse: 69   Resp: 14   Temp: 98.6 F (37 C)   TempSrc: Oral   SpO2: 99%     Physical Exam Physical exam: General: Vital signs reviewed.  Patient is well-developed and well-nourished, OBESE in no acute distress and cooperative with exam. Head: Normocephalic and atraumatic. Eyes: EOMI, conjunctivae normal, no scleral icterus. Neck: Supple, trachea midline, normal ROM, no JVD, masses, thyromegaly, or carotid bruit  present. Cardiovascular: RRR, S1 normal, S2 normal, no murmurs, gallops, or rubs. Pulmonary/Chest: Clear to auscultation bilaterally, no wheezes, rales, or rhonchi. Abdominal: Soft, non-tender, non-distended, BS +, no masses, organomegaly, or guarding present. Musculoskeletal: No joint deformities, erythema, or stiffness, ROM full and nontender. Extremities: No lower extremity edema bilaterally,  pulses symmetric and intact bilaterally. No cyanosis or clubbing. Neurological: A&O x3, Strength is normal Skin: Warm, dry and intact. No rashes or erythema. Psychiatric: Normal mood and affect. speech and behavior is normal. Cognition and memory are normal.     ROS  Last 3 Office BP readings: BP Readings from Last 3 Encounters:  04/16/24 (!) 140/90  03/26/24 (!) 144/92  12/26/23 130/88    BMET    Component Value Date/Time   NA 144 11/28/2023 1137   K 4.4 11/28/2023 1137   CL 104 11/28/2023 1137   CO2 26 11/28/2023 1137   GLUCOSE 88 11/28/2023 1137   GLUCOSE 110 (H) 03/17/2023 1258   BUN 13 11/28/2023 1137   CREATININE 0.85 11/28/2023 1137   CALCIUM 9.3 11/28/2023 1137   GFRNONAA >60 03/17/2023 1258   GFRAA >60 05/09/2020 1212    Renal function: CrCl cannot be calculated (Patient's most recent lab result is older than the maximum 21 days allowed.).  Clinical ASCVD: Yes  The 10-year ASCVD risk score (Arnett DK, et al., 2019) is: 12.3%   Values used to calculate the score:     Age: 59 years     Clincally relevant sex: Male     Is Non-Hispanic African American: No     Diabetic: No     Tobacco smoker: Yes     Systolic Blood Pressure: 140 mmHg     Is BP treated: Yes     HDL Cholesterol: 40 mg/dL     Total Cholesterol: 200 mg/dL  ASCVD risk factors include- ITALY   ASSESSMENT & PLAN: Oleg was seen today for hypertension.  Diagnoses and all orders for this visit:  Colon cancer screening -     Fecal occult blood, imunochemical   Essential hypertension BP goal - <  130/80 Explained that having normal blood pressure is the goal and medications are helping to get to goal and maintain normal blood pressure. DIET: Limit salt intake, read nutrition labels to check salt content, limit fried and high fatty foods  Avoid using multisymptom OTC cold preparations that generally contain sudafed which can rise BP. Consult with pharmacist on best cold relief products to use for persons with HTN EXERCISE Discussed incorporating exercise such as walking - 30 minutes most days of the week and can do in 10 minute intervals    Added hydrochlorothiazide  25mg  QAM  Colon cancer screening -     Fecal occult blood, imunochemical  acute bilateral thoracic back pain -     ibuprofen  (ADVIL ) 800 MG tablet; Take 1 tablet (800 mg total) by mouth  every 8 (eight) hours as needed for mild pain (pain score 1-3) or headache.  New onset of headaches -     ibuprofen  (ADVIL ) 800 MG tablet; Take 1 tablet (800 mg total) by mouth every 8 (eight) hours as needed for mild pain (pain score 1-3) or headache.  Other orders -     hydrochlorothiazide  (HYDRODIURIL ) 25 MG tablet; Take 1 tablet (25 mg total) by mouth daily.     This note has been created with Education officer, environmental. Any transcriptional errors are unintentional.   Rosaline SHAUNNA Bohr, NP 04/16/2024, 9:46 AM

## 2024-04-16 NOTE — Progress Notes (Signed)
Pt is here for BP check.

## 2024-05-02 ENCOUNTER — Encounter (INDEPENDENT_AMBULATORY_CARE_PROVIDER_SITE_OTHER): Payer: Self-pay | Admitting: Primary Care

## 2024-05-07 ENCOUNTER — Telehealth (INDEPENDENT_AMBULATORY_CARE_PROVIDER_SITE_OTHER): Payer: Self-pay | Admitting: Primary Care

## 2024-05-07 NOTE — Telephone Encounter (Signed)
 Called pt to reschedule appt. Pt did not answer and could not LVM for pt but if call back please reschedule appt.

## 2024-05-14 ENCOUNTER — Ambulatory Visit (INDEPENDENT_AMBULATORY_CARE_PROVIDER_SITE_OTHER): Payer: Self-pay | Admitting: Primary Care

## 2024-07-03 ENCOUNTER — Other Ambulatory Visit: Payer: Self-pay

## 2024-07-10 LAB — FECAL OCCULT BLOOD, IMMUNOCHEMICAL

## 2024-07-16 ENCOUNTER — Ambulatory Visit (INDEPENDENT_AMBULATORY_CARE_PROVIDER_SITE_OTHER): Payer: Self-pay | Admitting: Primary Care
